# Patient Record
Sex: Male | Born: 1990 | Hispanic: Yes | Marital: Single | Smoking: Current some day smoker
Health system: Southern US, Community
[De-identification: ages and names within clinical notes are randomized; demographics above are authoritative.]

## PROBLEM LIST (undated history)

## (undated) DIAGNOSIS — K219 Gastro-esophageal reflux disease without esophagitis: Secondary | ICD-10-CM

## (undated) HISTORY — PX: FINGER SURGERY: SHX640

---
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
## 2010-09-28 ENCOUNTER — Emergency Department: Payer: Self-pay | Admitting: Emergency Medicine

## 2011-03-07 ENCOUNTER — Emergency Department: Payer: Self-pay | Admitting: Emergency Medicine

## 2012-10-24 ENCOUNTER — Emergency Department: Payer: Self-pay | Admitting: Emergency Medicine

## 2014-10-08 ENCOUNTER — Emergency Department: Payer: Self-pay | Admitting: Emergency Medicine

## 2015-09-12 ENCOUNTER — Encounter: Payer: Self-pay | Admitting: Emergency Medicine

## 2015-09-12 ENCOUNTER — Emergency Department
Admission: EM | Admit: 2015-09-12 | Discharge: 2015-09-12 | Disposition: A | Discharge status: Home / Self Care | Payer: Self-pay | Attending: Emergency Medicine | Admitting: Emergency Medicine

## 2015-09-12 DIAGNOSIS — F17200 Nicotine dependence, unspecified, uncomplicated: Secondary | ICD-10-CM | POA: Insufficient documentation

## 2015-09-12 DIAGNOSIS — M7592 Shoulder lesion, unspecified, left shoulder: Secondary | ICD-10-CM | POA: Insufficient documentation

## 2015-09-12 DIAGNOSIS — M7582 Other shoulder lesions, left shoulder: Secondary | ICD-10-CM

## 2015-09-12 MED ORDER — ETODOLAC 500 MG PO TABS: 500.0000 mg | ORAL_TABLET | Freq: Two times a day (BID) | ORAL | Status: DC

## 2015-09-12 NOTE — Discharge Instructions (Signed)

## 2015-09-12 NOTE — ED Notes (Signed)
Pt states has left shoulder pain for over one month. Pt states pain is worse with movement and when lying on left side. Cms intact to left fingers. Pt appears in no acute distress.

## 2015-09-12 NOTE — ED Provider Notes (Signed)
CSN: [id number]     Arrival date & time 09/12/15  2033 History   First MD Initiated Contact with Patient 09/12/15 2056     Chief Complaint  Patient presents with  . Shoulder Pain     (Consider location/radiation/quality/duration/timing/severity/associated sxs/prior Treatment) HPI  24 year old male presents emergent department for evaluation of left shoulder pain. He is left-hand dominant. He has had several months of pain with no trauma or injury. Patient's pain is sharp and located along the lateral deltoid. Pain is increased with overhead lifting pushing and pulling. He denies any neck pain numbness or tingling. He is not taking any medications for pain. He has been given follow-up appointments with orthopedics but, has failed to follow-up.  History reviewed. No pertinent past medical history. History reviewed. No pertinent past surgical history. No family history on file. Social History  Substance Use Topics  . Smoking status: Current Some Day Smoker  . Smokeless tobacco: Never Used  . Alcohol Use: No    Review of Systems  Constitutional: Negative.  Negative for fever, chills, activity change and appetite change.  HENT: Negative for congestion, ear pain, mouth sores, rhinorrhea, sinus pressure, sore throat and trouble swallowing.   Eyes: Negative for photophobia, pain and discharge.  Respiratory: Negative for cough, chest tightness and shortness of breath.   Cardiovascular: Negative for chest pain and leg swelling.  Gastrointestinal: Negative for nausea, vomiting, abdominal pain, diarrhea and abdominal distention.  Genitourinary: Negative for dysuria and difficulty urinating.  Musculoskeletal: Positive for arthralgias. Negative for back pain and gait problem.  Skin: Negative for color change and rash.  Neurological: Negative for dizziness and headaches.  Hematological: Negative for adenopathy.  Psychiatric/Behavioral: Negative for behavioral problems and agitation.       Allergies  Review of patient's allergies indicates no known allergies.  Home Medications   Prior to Admission medications   Medication Sig Start Date End Date Taking? Authorizing Provider  etodolac (LODINE) 500 MG tablet Take 1 tablet (500 mg total) by mouth 2 (two) times daily. 09/12/15   Evon Slack, PA-C   There were no vitals taken for this visit. Physical Exam  Constitutional: He is oriented to person, place, and time. He appears well-developed and well-nourished.  HENT:  Head: Normocephalic and atraumatic.  Eyes: Conjunctivae and EOM are normal. Pupils are equal, round, and reactive to light.  Neck: Normal range of motion. Neck supple.  Cardiovascular: Normal rate, regular rhythm and intact distal pulses.   Pulmonary/Chest: Effort normal. No respiratory distress. He exhibits no tenderness.  Musculoskeletal: Normal range of motion. He exhibits no edema or tenderness.  Left Upper Extremity: Examination of the left shoulder and arm showed no bony abnormality or edema.  Patient has full range of motion of the left shoulder with abduction, flexion, internal, external rotation..  The patient has mild pain with shoulder motion, mostly abduction and flexion.  The patient has a positive Hawkins and positive impingement test.  The patient has a negative drop arm test. The patient has a negative yergasons and speeds test.  The patient is non-tender along the deltoid muscle.  There is no subacromial space tenderness with no AC joint tenderness.  The patient has no instability of the shoulder with anterior-posterior motion.  There is a negative sulcus sign.  The rotator cuff muscle strength is 5/5 with supraspinatus, 5/5 with internal rotation, and 5/5 with external rotation.  There is no crepitus with range of motion activities.    Neurological: The patient has sensation that  is intact to light touch and pinprick bilaterally.  The patient has normal grip strength.  The patient has full  biceps, wrist extension, grip, and interosseous strength.  The patient has 2 + DTRs bilaterally.  Vascular: The patient has less than 2 second capillary refill.  The patient has normal ulnar and radial pulses.  The patient has normal warmth to touch.    Neurological: He is alert and oriented to person, place, and time.  Skin: Skin is warm and dry.  Psychiatric: He has a normal mood and affect. His behavior is normal. Judgment and thought content normal.    ED Course  Procedures (including critical care time) Labs Review Labs Reviewed - No data to display  Imaging Review No results found. I have personally reviewed and evaluated these images and lab results as part of my medical decision-making.   EKG Interpretation None      MDM   Final diagnoses:  Rotator cuff tendinitis, left    24 year old male with off and on low shoulder pain. No trauma or injury. No limitations in range of motion. He is not taking any medications. Will start etodolac 500 mg 1 tab by mouth twice a day with meals. Follow-up with orthopedics in 5-7 days if no improvement.    Evon Slackhomas C Yamin Swingler, PA-C 09/12/15 2133  Phineas SemenGraydon Goodman, MD 09/13/15 [phone]

## 2016-01-27 ENCOUNTER — Emergency Department
Admission: EM | Admit: 2016-01-27 | Discharge: 2016-01-27 | Disposition: A | Discharge status: Home / Self Care | Payer: Self-pay | Attending: Student | Admitting: Student

## 2016-01-27 ENCOUNTER — Encounter: Payer: Self-pay | Admitting: Emergency Medicine

## 2016-01-27 DIAGNOSIS — F17200 Nicotine dependence, unspecified, uncomplicated: Secondary | ICD-10-CM | POA: Insufficient documentation

## 2016-01-27 DIAGNOSIS — Z791 Long term (current) use of non-steroidal anti-inflammatories (NSAID): Secondary | ICD-10-CM | POA: Insufficient documentation

## 2016-01-27 DIAGNOSIS — H9201 Otalgia, right ear: Secondary | ICD-10-CM

## 2016-01-27 DIAGNOSIS — H6011 Cellulitis of right external ear: Secondary | ICD-10-CM | POA: Insufficient documentation

## 2016-01-27 MED ORDER — ACETAMINOPHEN-CODEINE #3 300-30 MG PO TABS: 1.0000 | ORAL_TABLET | Freq: Four times a day (QID) | ORAL | Status: DC | PRN

## 2016-01-27 MED ORDER — CIPROFLOXACIN-HYDROCORTISONE 0.2-1 % OT SUSP: 3.0000 [drp] | Freq: Two times a day (BID) | OTIC | Status: DC

## 2016-01-27 NOTE — ED Notes (Signed)
Pt discharged home after verbalizing understanding of discharge instructions; nad noted. 

## 2016-01-27 NOTE — ED Provider Notes (Signed)
[facility] Emergency Department Provider Note ____________________________________________  Time seen: 1700  I have reviewed the triage vital signs and the nursing notes.  HISTORY  Chief Complaint  Otalgia  HPI Willie Delacruz is a 25 y.o. male to the ED for evaluation of a 4 day complaint of right ear pain. Some decreased hearing in the right side and pain to the posterior part of the ear, the lower part of the jaw and the right neck. He denies any fevers, chills, or sweats. He has denies any vertigo, or dizziness. He has not had any drainage from the ear and denies any recent trauma. He has been using Tylenol, thorax, and ear drops without significant relief to his symptoms.He reports his discomfort at 7/10 in triage.   History reviewed. No pertinent past medical history.  There are no active problems to display for this patient.  History reviewed. No pertinent past surgical history.  Current Outpatient Rx  Name  Route  Sig  Dispense  Refill  . acetaminophen-codeine (TYLENOL #3) 300-30 MG tablet   Oral   Take 1 tablet by mouth every 6 (six) hours as needed for moderate pain.   10 tablet   0   . ciprofloxacin-hydrocortisone (CIPRO HC) otic suspension   Right Ear   Place 3 drops into the right ear 2 (two) times daily. Instill in affected ear as directed   10 mL   0   . etodolac (LODINE) 500 MG tablet   Oral   Take 1 tablet (500 mg total) by mouth 2 (two) times daily.   30 tablet   0    Allergies Review of patient's allergies indicates no known allergies.  History reviewed. No pertinent family history.  Social History Social History  Substance Use Topics  . Smoking status: Current Some Day Smoker  . Smokeless tobacco: Never Used  . Alcohol Use: No    Review of Systems  Constitutional: Negative for fever. Eyes: Negative for visual changes. ENT: Negative for sore throat. Right ear pain as above Skin: Negative for rash. Neurological:  Negative for headaches, focal weakness or numbness. ____________________________________________  PHYSICAL EXAM:  VITAL SIGNS: ED Triage Vitals  Enc Vitals Group     BP 01/27/16 1557 119/71 mmHg     Pulse Rate 01/27/16 1557 61     Resp 01/27/16 1557 18     Temp 01/27/16 1557 98.3 F (36.8 C)     Temp Source 01/27/16 1557 Oral     SpO2 01/27/16 1557 98 %     Weight 01/27/16 1557 232 lb (105.235 kg)     Height 01/27/16 1557 5\' 6"  (1.676 m)     Head Cir --      Peak Flow --      Pain Score 01/27/16 1627 7     Pain Loc --      Pain Edu? --      Excl. in GC? --    Constitutional: Alert and oriented. Well appearing and in no distress. Head: Normocephalic and atraumatic.      Eyes: Conjunctivae are normal. PERRL. Normal extraocular movements      Ears: Canals clear. TMs intact bilaterally. Right ear canal with a small, erupted pustule in the proximal portion. Local erythema noted. Serous drainage noted.    Nose: No congestion/rhinorrhea.   Mouth/Throat: Mucous membranes are moist.   Neck: Supple. No thyromegaly. Hematological/Lymphatic/Immunological: No cervical or preauricular lymphadenopathy. Respiratory: Normal respiratory effort.  Musculoskeletal: Nontender with normal range of motion  in all extremities.  Neurologic:  Normal gait without ataxia. Normal speech and language. No gross focal neurologic deficits are appreciated. ____________________________________________  INITIAL IMPRESSION / ASSESSMENT AND PLAN / ED COURSE  Patient with acute right ear canal pain secondary to an infected pustule/inclusion cyst. He discharged with a prescription for Cortisporin HC and Tylenol #3 to dose as directed. He will follow-up with ENT if needed. ____________________________________________  FINAL CLINICAL IMPRESSION(S) / ED DIAGNOSES  Final diagnoses:  Otalgia of right ear  Cellulitis of ear canal, right      Lissa Hoard, PA-C 01/27/16 1734  Gayla Doss,  MD 01/27/16 2011

## 2016-01-27 NOTE — ED Notes (Signed)
Reports right ear pain.  States he tried many otc products but not helping.

## 2016-01-27 NOTE — Discharge Instructions (Signed)
Ear Drops, Adult You have been diagnosed with a condition requiring you to put drops of medicine into your outer ear. HOME CARE INSTRUCTIONS   Put drops in the affected ear as instructed. After putting the drops in, you will need to lie down with the affected ear facing up for ten minutes so the drops will remain in the ear canal and run down and fill the canal. Continue using the ear drops for as long as directed by your health care provider.  Prior to getting up, put a cotton ball gently in your ear canal. Leave enough of the cotton ball out so it can be easily removed. Do not attempt to push this down into the canal with a cotton-tipped swab or other instrument.  Do not irrigate or wash out your ears if you have had a perforated eardrum or mastoid surgery, or unless instructed to do so by your health care provider.  Keep appointments with your health care provider as instructed.  Finish all medicine, or use for the length of time prescribed by your health care provider. Continue the drops even if your problem seems to be doing well after a couple days, or continue as instructed. SEEK MEDICAL CARE IF:  You become worse or develop increasing pain.  You notice any unusual drainage from your ear (particularly if the drainage has a bad smell).  You develop hearing difficulties.  You experience a serious form of dizziness in which you feel as if the room is spinning, and you feel nauseated (vertigo).  The outside of your ear becomes red or swollen or both. This may be a sign of an allergic reaction. MAKE SURE YOU:   Understand these instructions.  Will watch your condition.  Will get help right away if you are not doing well or get worse.   This information is not intended to replace advice given to you by your health care provider. Make sure you discuss any questions you have with your health care provider.   Document Released: 09/14/2001 Document Revised: 10/11/2014 Document  Reviewed: 04/17/2013 Elsevier Interactive Patient Education 2016 Elsevier Inc.  Cellulitis Cellulitis is an infection of the skin and the tissue under the skin. The infected area is usually red and tender. This happens most often in the arms and lower legs. HOME CARE   Take your antibiotic medicine as told. Finish the medicine even if you start to feel better.  Keep the infected arm or leg raised (elevated).  Put a warm cloth on the area up to 4 times per day.  Only take medicines as told by your doctor.  Keep all doctor visits as told. GET HELP IF:  You see red streaks on the skin coming from the infected area.  Your red area gets bigger or turns a dark color.  Your bone or joint under the infected area is painful after the skin heals.  Your infection comes back in the same area or different area.  You have a puffy (swollen) bump in the infected area.  You have new symptoms.  You have a fever. GET HELP RIGHT AWAY IF:   You feel very sleepy.  You throw up (vomit) or have watery poop (diarrhea).  You feel sick and have muscle aches and pains.   This information is not intended to replace advice given to you by your health care provider. Make sure you discuss any questions you have with your health care provider.   Document Released: 03/08/2008 Document Revised: 06/11/2015 Document  Reviewed: 12/06/2011 Elsevier Interactive Patient Education [employer] Elsevier [employer]   Your have a small pimple in the right ear canal. Use the drops as directed. Take the Tylenol w/ codeine as needed. See the specialist as needed.

## 2016-01-28 ENCOUNTER — Telehealth: Payer: Self-pay | Admitting: Emergency Medicine

## 2016-01-29 ENCOUNTER — Emergency Department
Admission: EM | Admit: 2016-01-29 | Discharge: 2016-01-29 | Disposition: A | Discharge status: Home / Self Care | Payer: Self-pay | Attending: Emergency Medicine | Admitting: Emergency Medicine

## 2016-01-29 DIAGNOSIS — F17200 Nicotine dependence, unspecified, uncomplicated: Secondary | ICD-10-CM | POA: Insufficient documentation

## 2016-01-29 DIAGNOSIS — H6691 Otitis media, unspecified, right ear: Secondary | ICD-10-CM

## 2016-01-29 DIAGNOSIS — Z791 Long term (current) use of non-steroidal anti-inflammatories (NSAID): Secondary | ICD-10-CM | POA: Insufficient documentation

## 2016-01-29 DIAGNOSIS — H6091 Unspecified otitis externa, right ear: Secondary | ICD-10-CM

## 2016-01-29 MED ORDER — PREDNISONE 20 MG PO TABS: ORAL_TABLET | ORAL | Status: DC

## 2016-01-29 MED ORDER — AMOXICILLIN 875 MG PO TABS: 875.0000 mg | ORAL_TABLET | Freq: Two times a day (BID) | ORAL | Status: DC

## 2016-01-29 NOTE — ED Provider Notes (Signed)
[facility] Emergency Department Provider Note  ____________________________________________  Time seen: Approximately 9:19 PM  I have reviewed the triage vital signs and the nursing notes.   HISTORY  Chief Complaint Otalgia    HPI Willie Delacruz is a 25 y.o. male , NAD, presents to the emergency department with continued right ear pain. States he was seen here Saturday diagnosed with an ear infection. Was given antibiotic drops and pain medications in which he states has not helped. Has not followed up with a primary care provider nor ENT since his visit. States he is here as he "wants medications that will actually work". Has not noted any increase in pain but has not noted any relief. Denies any discharge from the ears. Has not had any nasal congestion, runny nose, cough, chest congestion, sinus pressure, headaches, visual changes, sneezing. No fevers, chills, body aches.   No past medical history on file.  There are no active problems to display for this patient.   No past surgical history on file.  Current Outpatient Rx  Name  Route  Sig  Dispense  Refill  . acetaminophen-codeine (TYLENOL #3) 300-30 MG tablet   Oral   Take 1 tablet by mouth every 6 (six) hours as needed for moderate pain.   10 tablet   0   . amoxicillin (AMOXIL) 875 MG tablet   Oral   Take 1 tablet (875 mg total) by mouth 2 (two) times daily.   20 tablet   0   . ciprofloxacin-hydrocortisone (CIPRO HC) otic suspension   Right Ear   Place 3 drops into the right ear 2 (two) times daily. Instill in affected ear as directed   10 mL   0   . etodolac (LODINE) 500 MG tablet   Oral   Take 1 tablet (500 mg total) by mouth 2 (two) times daily.   30 tablet   0   . predniSONE (DELTASONE) 20 MG tablet      Take 2 tablets by mouth, once daily, for 5 days   10 tablet   0     Allergies Review of patient's allergies indicates no known allergies.  No family history on  file.  Social History Social History  Substance Use Topics  . Smoking status: Current Some Day Smoker  . Smokeless tobacco: Never Used  . Alcohol Use: No     Review of Systems  Constitutional: No fever/chills Eyes: No visual changes. No discharge on the swelling, erythema ENT: Positive right ear pain. No sore throat, nasal congestion, sneezing, runny nose, sinus pressure, ear drainage, tinnitus. Cardiovascular: No chest pain. Respiratory: No cough or chest congestion. No shortness of breath. No wheezing.  Gastrointestinal: No abdominal pain.  No nausea, vomiting.  Musculoskeletal: Negative for neck pain, general myalgias.  Skin: Negative for rash. Neurological: Negative for headaches, focal weakness or numbness. 10-point ROS otherwise negative.  ____________________________________________   PHYSICAL EXAM:  VITAL SIGNS: ED Triage Vitals  Enc Vitals Group     BP 01/29/16 2048 148/88 mmHg     Pulse Rate 01/29/16 2048 77     Resp 01/29/16 2048 18     Temp 01/29/16 2048 98.2 F (36.8 C)     Temp Source 01/29/16 2048 Oral     SpO2 01/29/16 2048 98 %     Weight 01/29/16 2048 225 lb (102.059 kg)     Height 01/29/16 2048  (1.676 m)     Head Cir --  Peak Flow --      Pain Score 01/29/16 2049 8     Pain Loc --      Pain Edu? --      Excl. in GC? --      Constitutional: Alert and oriented. Well appearing and in no acute distress. Eyes: Conjunctivae are normal. Head: Atraumatic. ENT:      Ears: Right external ear canal with mild erythema and trace swelling. Right TM visualized with mild erythema and mild serous effusion but no bulging or operation. Left TM visualized within normal limits. Right TM with decreased light reflex but left TM with normal light reflex.      Nose: No congestion/rhinnorhea.      Mouth/Throat: Mucous membranes are moist. Clear postnasal drip. No pharyngeal erythema, swelling, exudate. Neck: Supple with full range of  motion Hematological/Lymphatic/Immunilogical: No cervical lymphadenopathy. Cardiovascular: Normal rate, regular rhythm. Normal S1 and S2.   Respiratory: Normal respiratory effort without tachypnea or retractions. Lungs CTAB with breath sounds noted in all lung fields. Neurologic:  Normal speech and language. No gross focal neurologic deficits are appreciated.  Skin:  Skin is warm, dry and intact. No rash noted. Psychiatric: Mood and affect are normal. Speech and behavior are normal. Patient exhibits appropriate insight and judgement.   ____________________________________________   LABS  None ____________________________________________  EKG  None ____________________________________________  RADIOLOGY  None ____________________________________________    PROCEDURES  Procedure(s) performed: None    Medications - No data to display   ____________________________________________   INITIAL IMPRESSION / ASSESSMENT AND PLAN / ED COURSE  Patient's diagnosis is consistent with right otitis externa and right otitis media. Patient will be discharged home with prescriptions for amoxicillin and prednisone to take as directed. Patient should continue to use ear drops as previously prescribed. Patient is to follow up with Dr. Andee PolesVaught in ENT if symptoms persist past this treatment course. Patient is given ED precautions to return to the ED for any worsening or new symptoms.      ____________________________________________  FINAL CLINICAL IMPRESSION(S) / ED DIAGNOSES  Final diagnoses:  Otitis externa, right  Acute right otitis media, recurrence not specified, unspecified otitis media type      NEW MEDICATIONS STARTED DURING THIS VISIT:  Discharge Medication List as of 01/29/2016  9:20 PM    START taking these medications   Details  amoxicillin (AMOXIL) 875 MG tablet Take 1 tablet (875 mg total) by mouth 2 (two) times daily., Starting 01/29/2016, Until Discontinued,  Print    predniSONE (DELTASONE) 20 MG tablet Take 2 tablets by mouth, once daily, for 5 days, Print             Hope PigeonJami L Jailen Coward, PA-C 01/29/16 2138  Jeanmarie PlantJames A McShane, MD 01/29/16 [phone]

## 2016-01-29 NOTE — ED Notes (Signed)
Pt was seen here Saturday and dx with ear infection has been taking antibiotic drops without any relief.

## 2016-01-29 NOTE — Discharge Instructions (Signed)
Otitis Externa Otitis externa is a germ infection in the outer ear. The outer ear is the area from the eardrum to the outside of the ear. Otitis externa is sometimes called "swimmer's ear." HOME CARE  Put drops in the ear as told by your doctor.  Only take medicine as told by your doctor.  If you have diabetes, your doctor may give you more directions. Follow your doctor's directions.  Keep all doctor visits as told. To avoid another infection:  Keep your ear dry. Use the corner of a towel to dry your ear after swimming or bathing.  Avoid scratching or putting things inside your ear.  Avoid swimming in lakes, dirty water, or pools that use a chemical called chlorine poorly.  You may use ear drops after swimming. Combine equal amounts of white vinegar and alcohol in a bottle. Put 3 or 4 drops in each ear. GET HELP IF:   You have a fever.  Your ear is still red, puffy (swollen), or painful after 3 days.  You still have yellowish-white fluid (pus) coming from the ear after 3 days.  Your redness, puffiness, or pain gets worse.  You have a really bad headache.  You have redness, puffiness, pain, or tenderness behind your ear. MAKE SURE YOU:   Understand these instructions.  Will watch your condition.  Will get help right away if you are not doing well or get worse.   This information is not intended to replace advice given to you by your health care provider. Make sure you discuss any questions you have with your health care provider.   Document Released: 03/08/2008 Document Revised: 10/11/2014 Document Reviewed: 10/07/2011 Elsevier Interactive Patient Education 2016 [employer].  Otitis Media, Adult Otitis media is redness, soreness, and inflammation of the middle ear. Otitis media may be caused by allergies or, most commonly, by infection. Often it occurs as a complication of the common cold. SIGNS AND SYMPTOMS Symptoms of otitis media may  include:  Earache.  Fever.  Ringing in your ear.  Headache.  Leakage of fluid from the ear. DIAGNOSIS To diagnose otitis media, your health care provider will examine your ear with an otoscope. This is an instrument that allows your health care provider to see into your ear in order to examine your eardrum. Your health care provider also will ask you questions about your symptoms. TREATMENT  Typically, otitis media resolves on its own within 3-5 days. Your health care provider may prescribe medicine to ease your symptoms of pain. If otitis media does not resolve within 5 days or is recurrent, your health care provider may prescribe antibiotic medicines if he or she suspects that a bacterial infection is the cause. HOME CARE INSTRUCTIONS   If you were prescribed an antibiotic medicine, finish it all even if you start to feel better.  Take medicines only as directed by your health care provider.  Keep all follow-up visits as directed by your health care provider. SEEK MEDICAL CARE IF:  You have otitis media only in one ear, or bleeding from your nose, or both.  You notice a lump on your neck.  You are not getting better in 3-5 days.  You feel worse instead of better. SEEK IMMEDIATE MEDICAL CARE IF:   You have pain that is not controlled with medicine.  You have swelling, redness, or pain around your ear or stiffness in your neck.  You notice that part of your face is paralyzed.  You notice that the bone  behind your ear (mastoid) is tender when you touch it. °MAKE SURE YOU:  °· Understand these instructions. °· Will watch your condition. °· Will get help right away if you are not doing well or get worse. °  °This information is not intended to replace advice given to you by your health care provider. Make sure you discuss any questions you have with your health care provider. °  °Document Released: 06/25/2004 Document Revised: 10/11/2014 Document Reviewed: 04/17/2013 °Elsevier  Interactive Patient Education ©2016 Elsevier Inc. ° °

## 2016-08-26 ENCOUNTER — Emergency Department
Admission: EM | Admit: 2016-08-26 | Discharge: 2016-08-26 | Disposition: A | Discharge status: Home / Self Care | Payer: Managed Care, Other (non HMO) | Attending: Emergency Medicine | Admitting: Emergency Medicine

## 2016-08-26 DIAGNOSIS — Z79899 Other long term (current) drug therapy: Secondary | ICD-10-CM | POA: Diagnosis not present

## 2016-08-26 DIAGNOSIS — M542 Cervicalgia: Secondary | ICD-10-CM | POA: Diagnosis not present

## 2016-08-26 DIAGNOSIS — F17200 Nicotine dependence, unspecified, uncomplicated: Secondary | ICD-10-CM | POA: Insufficient documentation

## 2016-08-26 DIAGNOSIS — M62838 Other muscle spasm: Secondary | ICD-10-CM | POA: Insufficient documentation

## 2016-08-26 DIAGNOSIS — M25511 Pain in right shoulder: Secondary | ICD-10-CM | POA: Diagnosis present

## 2016-08-26 MED ORDER — ORPHENADRINE CITRATE 30 MG/ML IJ SOLN
60.0000 mg | Freq: Two times a day (BID) | INTRAMUSCULAR | Status: DC
  Administered 2016-08-26: 60 mg via INTRAMUSCULAR
  Filled 2016-08-26: qty 2

## 2016-08-26 MED ORDER — CYCLOBENZAPRINE HCL 10 MG PO TABS: 10.0000 mg | ORAL_TABLET | Freq: Three times a day (TID) | ORAL | 0 refills | Status: DC | PRN

## 2016-08-26 MED ORDER — KETOROLAC TROMETHAMINE 30 MG/ML IJ SOLN
30.0000 mg | Freq: Once | INTRAMUSCULAR | Status: AC
  Administered 2016-08-26: 30 mg via INTRAMUSCULAR
  Filled 2016-08-26: qty 1

## 2016-08-26 MED ORDER — NAPROXEN 500 MG PO TABS: 500.0000 mg | ORAL_TABLET | Freq: Two times a day (BID) | ORAL | 0 refills | Status: DC

## 2016-08-26 NOTE — ED Notes (Signed)
Pt has right shoulder pain.  No known injury.  Pain for 3 days.  Pt states painful when sleeping on right side.  Pain radiates into neck and right hand.  Pt alert.

## 2016-08-26 NOTE — ED Triage Notes (Signed)
Reports right shoulder pain for 2 days that has been steadily increasing.

## 2016-08-26 NOTE — ED Provider Notes (Signed)
[facility] Emergency Department Provider Note  ____________________________________________  Time seen: Approximately 4:57 PM  I have reviewed the triage vital signs and the nursing notes.   HISTORY  Chief Complaint Shoulder Pain    HPI Willie Delacruz is a 25 y.o. male , NAD, presents to the emergency room with 3 day history of right shoulder pain. Patient states he woke with pain about his right neck and shoulder approximately 3 days ago. States the pain has worsened and is now extending into his upper arm and right ear. Has not taken anything over-the-counter for the pain. Denies any injuries, traumas or falls. Has not noted any rashes, redness or swelling. No skin sores. Denies fevers, chills nor any drainage from the ears. Denies any chest pain, shortness breath, wheezing, abdominal pain, nausea or vomiting. No numbness, weakness, tingling.   No past medical history on file.  There are no active problems to display for this patient.   No past surgical history on file.  Prior to Admission medications   Medication Sig Start Date End Date Taking? Authorizing Provider  acetaminophen-codeine (TYLENOL #3) 300-30 MG tablet Take 1 tablet by mouth every 6 (six) hours as needed for moderate pain. 01/27/16   Jenise V Bacon Menshew, PA-C  amoxicillin (AMOXIL) 875 MG tablet Take 1 tablet (875 mg total) by mouth 2 (two) times daily. 01/29/16   Jami L Hagler, PA-C  ciprofloxacin-hydrocortisone (CIPRO HC) otic suspension Place 3 drops into the right ear 2 (two) times daily. Instill in affected ear as directed 01/27/16   Charlesetta IvoryJenise V Bacon Menshew, PA-C  cyclobenzaprine (FLEXERIL) 10 MG tablet Take 1 tablet (10 mg total) by mouth 3 (three) times daily as needed for muscle spasms. 08/26/16   Jami L Hagler, PA-C  etodolac (LODINE) 500 MG tablet Take 1 tablet (500 mg total) by mouth 2 (two) times daily. 09/12/15   Evon Slackhomas C Gaines, PA-C  naproxen (NAPROSYN) 500 MG tablet Take 1  tablet (500 mg total) by mouth 2 (two) times daily with a meal. 08/26/16   Jami L Hagler, PA-C  predniSONE (DELTASONE) 20 MG tablet Take 2 tablets by mouth, once daily, for 5 days 01/29/16   Jami L Hagler, PA-C    Allergies Patient has no known allergies.  No family history on file.  Social History Social History  Substance Use Topics  . Smoking status: Current Some Day Smoker  . Smokeless tobacco: Never Used  . Alcohol use No     Review of Systems  Constitutional: No fever/chills ENT: Positive right ear pain. No ear drainage. Cardiovascular: No chest pain. Respiratory:  No shortness of breath. No wheezing.  Gastrointestinal: No abdominal pain.  No nausea, vomiting.   Musculoskeletal: Positive right neck, shoulder pain. No back pain or other extremity pain. Skin: Negative for rash, redness, swelling, skin sores. Neurological: Negative for this, weakness, tingling. 10-point ROS otherwise negative.  ____________________________________________   PHYSICAL EXAM:  VITAL SIGNS: ED Triage Vitals  Enc Vitals Group     BP 08/26/16 1645 (!) 140/51     Pulse Rate 08/26/16 1645 68     Resp 08/26/16 1645 16     Temp 08/26/16 1645 98 F (36.7 C)     Temp Source 08/26/16 1645 Oral     SpO2 08/26/16 1645 98 %     Weight 08/26/16 1644 220 lb (99.8 kg)     Height 08/26/16 1644 5\' 6"  (1.676 m)     Head Circumference --  Peak Flow --      Pain Score 08/26/16 1646 8     Pain Loc --      Pain Edu? --      Excl. in GC? --      Constitutional: Alert and oriented. Well appearing and in no acute distress. Eyes: Conjunctivae are normal.  Head: Atraumatic. ENT:      Ears: Right TM visualized with out effusion, bulging, erythema or perforation      Nose: No congestion/rhinnorhea.      Mouth/Throat: Mucous membranes are moist.  Neck: No cervical spine tenderness to palpation. Significant tenderness to palpation about the right trapezial muscle with moderate muscle spasm  appreciated. Supple with full range of motion. No meningismus. Hematological/Lymphatic/Immunilogical: No cervical lymphadenopathy. Cardiovascular: Good peripheral circulation. Respiratory: Normal respiratory effort without tachypnea or retractions.  Musculoskeletal: No lower extremity tenderness nor edema.  No joint effusions. Neurologic:  Normal speech and language. No gross focal neurologic deficits are appreciated.  Skin:  Skin is warm, dry and intact. No rash, redness, swelling, skin sores noted. Psychiatric: Mood and affect are normal. Speech and behavior are normal. Patient exhibits appropriate insight and judgement.   ____________________________________________   LABS  None ____________________________________________  EKG  None ____________________________________________  RADIOLOGY  None ____________________________________________    PROCEDURES  Procedure(s) performed: None   Procedures   Medications  orphenadrine (NORFLEX) injection 60 mg (60 mg Intramuscular Given 08/26/16 1708)  ketorolac (TORADOL) 30 MG/ML injection 30 mg (30 mg Intramuscular Given 08/26/16 1708)     ____________________________________________   INITIAL IMPRESSION / ASSESSMENT AND PLAN / ED COURSE  Pertinent labs & imaging results that were available during my care of the patient were reviewed by me and considered in my medical decision making (see chart for details).  Clinical Course     Patient's diagnosis is consistent with Trapezial muscle spasm. Patient was given IM Toradol and Norflex while in the emergency department and tolerated well. Patient will be discharged home with prescriptions for naproxen and Flexeril to take as directed. [facility] if symptoms persist past this treatment course. Patient is given ED precautions to return to the ED for any worsening or new symptoms.    ____________________________________________  FINAL  CLINICAL IMPRESSION(S) / ED DIAGNOSES  Final diagnoses:  Trapezius muscle spasm      NEW MEDICATIONS STARTED DURING THIS VISIT:  New Prescriptions   CYCLOBENZAPRINE (FLEXERIL) 10 MG TABLET    Take 1 tablet (10 mg total) by mouth 3 (three) times daily as needed for muscle spasms.   NAPROXEN (NAPROSYN) 500 MG TABLET    Take 1 tablet (500 mg total) by mouth 2 (two) times daily with a meal.         Hope PigeonJami L Hagler, PA-C 08/26/16 1725    Jeanmarie PlantJames A McShane, MD 08/26/16 [phone]

## 2016-10-23 ENCOUNTER — Encounter: Payer: Self-pay | Admitting: Emergency Medicine

## 2016-10-23 ENCOUNTER — Emergency Department
Admission: EM | Admit: 2016-10-23 | Discharge: 2016-10-23 | Disposition: A | Discharge status: Home / Self Care | Payer: Managed Care, Other (non HMO) | Attending: Emergency Medicine | Admitting: Emergency Medicine

## 2016-10-23 DIAGNOSIS — F17200 Nicotine dependence, unspecified, uncomplicated: Secondary | ICD-10-CM | POA: Insufficient documentation

## 2016-10-23 DIAGNOSIS — J0140 Acute pansinusitis, unspecified: Secondary | ICD-10-CM | POA: Insufficient documentation

## 2016-10-23 DIAGNOSIS — R05 Cough: Secondary | ICD-10-CM | POA: Diagnosis present

## 2016-10-23 LAB — POCT RAPID STREP A: Streptococcus, Group A Screen (Direct): NEGATIVE

## 2016-10-23 MED ORDER — AMOXICILLIN-POT CLAVULANATE 875-125 MG PO TABS: 1.0000 | ORAL_TABLET | Freq: Two times a day (BID) | ORAL | 0 refills | Status: AC

## 2016-10-23 MED ORDER — PREDNISONE 10 MG PO TABS: ORAL_TABLET | ORAL | 0 refills | Status: DC

## 2016-10-23 NOTE — ED Provider Notes (Signed)
[facility] Emergency Department Provider Note  ____________________________________________   First MD Initiated Contact with Patient 10/23/16 [phone]     (approximate)  I have reviewed the triage vital signs and the nursing notes.   HISTORY  Chief Complaint Sore Throat   HPI Willie Delacruz is a 26 y.o. male is here with complaint of sore throat for 3 weeks. Patient states he has had an occasional cough with this. More recently he has experienced facial pain and body aches. He states that when he blows his nose his ear popping, he is also experiencing productive cough at times. Patient has been using over-the-counter medication without any relief of his symptoms. He also has continued using throat lozenges which have not helped with his sore throat. Patient admits to occasionally smoking and at the most smokes one half pack cigarettes per day. He is unaware of any previous asthma, bronchitis, pneumonia. Currently he rates his pain as 7 out of 10.   History reviewed. No pertinent past medical history.  There are no active problems to display for this patient.   History reviewed. No pertinent surgical history.  Prior to Admission medications   Medication Sig Start Date End Date Taking? Authorizing Provider  amoxicillin-clavulanate (AUGMENTIN) 875-125 MG tablet Take 1 tablet by mouth 2 (two) times daily. 10/23/16 10/30/16  Tommi Rumpshonda L Summers, PA-C  predniSONE (DELTASONE) 10 MG tablet Take 6 tablets  today, on day 2 take 5 tablets, day 3 take 4 tablets, day 4 take 3 tablets, day 5 take  2 tablets and 1 tablet the last day 10/23/16   Tommi Rumpshonda L Summers, PA-C    Allergies Patient has no known allergies.  No family history on file.  Social History Social History  Substance Use Topics  . Smoking status: Current Some Day Smoker    Packs/day: 0.50  . Smokeless tobacco: Never Used  . Alcohol use No    Review of Systems Constitutional: Subjective  fever/chills Eyes: No visual changes. ENT: Positive sore throat. Positive facial pressure. Positive ear pressure. Cardiovascular: Denies chest pain. Respiratory: Denies shortness of breath. Positive productive cough. Gastrointestinal:   No nausea, no vomiting.   Musculoskeletal: Negative for back pain. Neurological: Negative for headaches  10-point ROS otherwise negative.  ____________________________________________   PHYSICAL EXAM:  VITAL SIGNS: ED Triage Vitals  Enc Vitals Group     BP 10/23/16 0531 114/78     Pulse Rate 10/23/16 0531 76     Resp --      Temp 10/23/16 0531 98.3 F (36.8 C)     Temp Source 10/23/16 0531 Oral     SpO2 10/23/16 0531 99 %     Weight 10/23/16 0531 220 lb (99.8 kg)     Height 10/23/16 0531 5\' 6"  (1.676 m)     Head Circumference --      Peak Flow --      Pain Score 10/23/16 0540 7     Pain Loc --      Pain Edu? --      Excl. in GC? --     Constitutional: Alert and oriented. Well appearing and in no acute distress. Eyes: Conjunctivae are normal. PERRL. EOMI. Head: Atraumatic. Nose: Mild congestion/no rhinnorhea.  EACs are clear. TMs are dull with poor light reflex. No erythema or injection was seen. There is moderate tenderness to percussion bilateral frontal and maxillary sinuses. Mouth/Throat: Mucous membranes are moist.  Oropharynx non-erythematous. Positive posterior drainage. Neck: No stridor.   Hematological/Lymphatic/Immunilogical: No  cervical lymphadenopathy. Cardiovascular: Normal rate, regular rhythm. Grossly normal heart sounds.  Good peripheral circulation. Respiratory: Normal respiratory effort.  No retractions. Lungs CTAB. Musculoskeletal: Moves upper and lower extremities without any difficulty. Normal gait was noted. Neurologic:  Normal speech and language. No gross focal neurologic deficits are appreciated. No gait instability. Skin:  Skin is warm, dry and intact. No rash noted. Psychiatric: Mood and affect are normal.  Speech and behavior are normal.  ____________________________________________   LABS (all labs ordered are listed, but only abnormal results are displayed)  Labs Reviewed  POCT RAPID STREP A    PROCEDURES  Procedure(s) performed: None  Procedures  Critical Care performed: No  ____________________________________________   INITIAL IMPRESSION / ASSESSMENT AND PLAN / ED COURSE  Pertinent labs & imaging results that were available during my care of the patient were reviewed by me and considered in my medical decision making (see chart for details).  Patient was made aware that his strep test was negative. Patient was placed on Augmentin 875 twice a day for 10 days along with a prednisone 6 day taper. Patient is aware that he can take Tylenol if needed for pain or fever. He is to follow-up with [facility] if any continued problems.    ___________________________________________   FINAL CLINICAL IMPRESSION(S) / ED DIAGNOSES  Final diagnoses:  Acute non-recurrent pansinusitis      NEW MEDICATIONS STARTED DURING THIS VISIT:  Discharge Medication List as of 10/23/2016  7:38 AM    START taking these medications   Details  amoxicillin-clavulanate (AUGMENTIN) 875-125 MG tablet Take 1 tablet by mouth 2 (two) times daily., Starting Sat 10/23/2016, Until Sat 10/30/2016, Print         Note:  This document was prepared using Dragon voice recognition software and may include unintentional dictation errors.    Tommi Rumps, PA-C 10/23/16 1610    Minna Antis, MD 10/23/16 [phone]

## 2016-10-23 NOTE — Discharge Instructions (Signed)
[facility] if any continued problems. Plan taking Augmentin 875 twice a day for 10 days. Prednisone beginning with 6 tablets today and decreasing each day for the next 6 days. You may continue taking Tylenol if needed for headache or fever. Increase fluids.

## 2016-10-23 NOTE — ED Triage Notes (Signed)
Pt states that he has had a sore throat x3 weeks but now he is having generalized body aches. Pt is ambulatory to triage with NAD noted at this time.

## 2016-10-23 NOTE — ED Notes (Signed)
Pt reports sore throat x 3 weeks, progressively worse. Denies any sick contact. Subjective fevers reported. Pt states every time he coughs his whole face is sore.  Productive cough at times.

## 2018-01-28 ENCOUNTER — Emergency Department
Admission: EM | Admit: 2018-01-28 | Discharge: 2018-01-28 | Disposition: A | Discharge status: Home / Self Care | Payer: Managed Care, Other (non HMO) | Attending: Emergency Medicine | Admitting: Emergency Medicine

## 2018-01-28 ENCOUNTER — Encounter: Payer: Self-pay | Admitting: Emergency Medicine

## 2018-01-28 ENCOUNTER — Other Ambulatory Visit: Payer: Self-pay

## 2018-01-28 DIAGNOSIS — Y33XXXA Other specified events, undetermined intent, initial encounter: Secondary | ICD-10-CM | POA: Insufficient documentation

## 2018-01-28 DIAGNOSIS — F17200 Nicotine dependence, unspecified, uncomplicated: Secondary | ICD-10-CM | POA: Insufficient documentation

## 2018-01-28 DIAGNOSIS — Y998 Other external cause status: Secondary | ICD-10-CM | POA: Insufficient documentation

## 2018-01-28 DIAGNOSIS — Y929 Unspecified place or not applicable: Secondary | ICD-10-CM | POA: Insufficient documentation

## 2018-01-28 DIAGNOSIS — S29012A Strain of muscle and tendon of back wall of thorax, initial encounter: Secondary | ICD-10-CM | POA: Insufficient documentation

## 2018-01-28 DIAGNOSIS — Y93H2 Activity, gardening and landscaping: Secondary | ICD-10-CM | POA: Insufficient documentation

## 2018-01-28 DIAGNOSIS — T148XXA Other injury of unspecified body region, initial encounter: Secondary | ICD-10-CM

## 2018-01-28 MED ORDER — CYCLOBENZAPRINE HCL 10 MG PO TABS: 10.0000 mg | ORAL_TABLET | Freq: Three times a day (TID) | ORAL | 0 refills | Status: DC | PRN

## 2018-01-28 MED ORDER — IBUPROFEN 600 MG PO TABS: 600.0000 mg | ORAL_TABLET | Freq: Three times a day (TID) | ORAL | 0 refills | Status: DC | PRN

## 2018-01-28 MED ORDER — IBUPROFEN 600 MG PO TABS
600.0000 mg | ORAL_TABLET | Freq: Once | ORAL | Status: AC
  Administered 2018-01-28: 600 mg via ORAL
  Filled 2018-01-28: qty 1

## 2018-01-28 MED ORDER — TRAMADOL HCL 50 MG PO TABS: 50.0000 mg | ORAL_TABLET | Freq: Two times a day (BID) | ORAL | 0 refills | Status: DC | PRN

## 2018-01-28 MED ORDER — CYCLOBENZAPRINE HCL 10 MG PO TABS
10.0000 mg | ORAL_TABLET | Freq: Once | ORAL | Status: AC
  Administered 2018-01-28: 10 mg via ORAL
  Filled 2018-01-28: qty 1

## 2018-01-28 MED ORDER — TRAMADOL HCL 50 MG PO TABS
50.0000 mg | ORAL_TABLET | Freq: Once | ORAL | Status: AC
  Administered 2018-01-28: 50 mg via ORAL
  Filled 2018-01-28: qty 1

## 2018-01-28 NOTE — ED Provider Notes (Signed)
[facility] Emergency Department Provider Note   ____________________________________________   First MD Initiated Contact with Patient 01/28/18 [phone]     (approximate)  I have reviewed the triage vital signs and the nursing notes.   HISTORY  Chief Complaint Back Pain    HPI Willie Delacruz is a 27 y.o. male patient complain left upper back pain for 2 days.  Patient state onset of complaint after pulling a tarp doing yard work.  Patient described the pain is "aching".  Patient rates the pain as 10/10.  No palliative measures for complaint.  History reviewed. No pertinent past medical history.  There are no active problems to display for this patient.   History reviewed. No pertinent surgical history.  Prior to Admission medications   Medication Sig Start Date End Date Taking? Authorizing Provider  cyclobenzaprine (FLEXERIL) 10 MG tablet Take 1 tablet (10 mg total) by mouth 3 (three) times daily as needed. 01/28/18   Joni Reining, PA-C  ibuprofen (ADVIL,MOTRIN) 600 MG tablet Take 1 tablet (600 mg total) by mouth every 8 (eight) hours as needed. 01/28/18   Joni Reining, PA-C  predniSONE (DELTASONE) 10 MG tablet Take 6 tablets  today, on day 2 take 5 tablets, day 3 take 4 tablets, day 4 take 3 tablets, day 5 take  2 tablets and 1 tablet the last day 10/23/16   Tommi Rumps, PA-C  traMADol (ULTRAM) 50 MG tablet Take 1 tablet (50 mg total) by mouth every 12 (twelve) hours as needed. 01/28/18   Joni Reining, PA-C    Allergies Patient has no known allergies.  No family history on file.  Social History Social History   Tobacco Use  . Smoking status: Current Some Day Smoker    Packs/day: 0.00  . Smokeless tobacco: Never Used  Substance Use Topics  . Alcohol use: No  . Drug use: No    Review of Systems  Constitutional: No fever/chills Eyes: No visual changes. ENT: No sore throat. Cardiovascular: Denies chest pain. Respiratory: Denies  shortness of breath. Gastrointestinal: No abdominal pain.  No nausea, no vomiting.  No diarrhea.  No constipation. Genitourinary: Negative for dysuria. Musculoskeletal: Upper back pain. Skin: Negative for rash. Neurological: Negative for headaches, focal weakness or numbness.   ____________________________________________   PHYSICAL EXAM:  VITAL SIGNS: ED Triage Vitals  Enc Vitals Group     BP 01/28/18 0752 119/77     Pulse Rate 01/28/18 0752 (!) 52     Resp 01/28/18 0752 20     Temp 01/28/18 0752 98.2 F (36.8 C)     Temp Source 01/28/18 0752 Oral     SpO2 01/28/18 0752 96 %     Weight 01/28/18 0754 220 lb (99.8 kg)     Height 01/28/18 0754  (1.676 m)     Head Circumference --      Peak Flow --      Pain Score 01/28/18 0754 10     Pain Loc --      Pain Edu? --      Excl. in GC? --     Constitutional: Alert and oriented. Well appearing and in no acute distress. Neck: No stridor.  Hematological/Lymphatic/Immunilogical: No cervical lymphadenopathy. Cardiovascular: Normal rate, regular rhythm. Grossly normal heart sounds.  Good peripheral circulation. Respiratory: Normal respiratory effort.  No retractions. Lungs CTAB. Musculoskeletal: No obvious deformity of the upper back.  Patient has moderate guarding palpation inferior left scapular area.  Patient decreased  range of motion with overhead reaching.. Neurologic:  Normal speech and language. No gross focal neurologic deficits are appreciated. No gait instability. Skin:  Skin is warm, dry and intact. No rash noted. Psychiatric: Mood and affect are normal. Speech and behavior are normal.  ____________________________________________   LABS (all labs ordered are listed, but only abnormal results are displayed)  Labs Reviewed - No data to display ____________________________________________  EKG   ____________________________________________  RADIOLOGY  ED MD interpretation:    Official radiology  report(s): No results found.  ____________________________________________   PROCEDURES  Procedure(s) performed: None  Procedures  Critical Care performed: No  ____________________________________________   INITIAL IMPRESSION / ASSESSMENT AND PLAN / ED COURSE  As part of my medical decision making, I reviewed the following data within the electronic MEDICAL RECORD NUMBER    Upper back pain secondary to muscle strain.  Patient given discharge care instruction advised take medication as directed.  Patient advised [facility] or [facility]CP if no improvement in 3 to 5 days.      ____________________________________________   FINAL CLINICAL IMPRESSION(S) / ED DIAGNOSES  Final diagnoses:  Muscle strain     ED Discharge Orders        Ordered    traMADol (ULTRAM) 50 MG tablet  Every 12 hours PRN     01/28/18 0808    ibuprofen (ADVIL,MOTRIN) 600 MG tablet  Every 8 hours PRN     01/28/18 0808    cyclobenzaprine (FLEXERIL) 10 MG tablet  3 times daily PRN     01/28/18 7846       Note:  This document was prepared using Dragon voice recognition software and may include unintentional dictation errors.    Joni Reining, PA-C 01/28/18 0813    Schaevitz, Myra Rude, MD 01/28/18 [phone]

## 2018-01-28 NOTE — Discharge Instructions (Addendum)
Follow discharge care instruction take medication as directed. °

## 2018-01-28 NOTE — ED Notes (Signed)
See triage note  States he was doing some yard yard about 2 days ago   Developed some mid/upper back pain  States pain is moving into left arm and leg  Ambulates well to treatment room

## 2018-01-28 NOTE — ED Triage Notes (Signed)
Back pain x 2 days. States began when pulling tarp doing yard work 2 days ago.

## 2018-04-18 ENCOUNTER — Emergency Department
Admission: EM | Admit: 2018-04-18 | Discharge: 2018-04-18 | Disposition: A | Discharge status: Home / Self Care | Payer: Self-pay | Attending: Emergency Medicine | Admitting: Emergency Medicine

## 2018-04-18 ENCOUNTER — Emergency Department: Payer: Self-pay

## 2018-04-18 ENCOUNTER — Other Ambulatory Visit: Payer: Self-pay

## 2018-04-18 DIAGNOSIS — Y999 Unspecified external cause status: Secondary | ICD-10-CM | POA: Insufficient documentation

## 2018-04-18 DIAGNOSIS — S62651A Nondisplaced fracture of medial phalanx of left index finger, initial encounter for closed fracture: Secondary | ICD-10-CM

## 2018-04-18 DIAGNOSIS — Y93H2 Activity, gardening and landscaping: Secondary | ICD-10-CM | POA: Insufficient documentation

## 2018-04-18 DIAGNOSIS — S61215A Laceration without foreign body of left ring finger without damage to nail, initial encounter: Secondary | ICD-10-CM

## 2018-04-18 DIAGNOSIS — W278XXA Contact with other nonpowered hand tool, initial encounter: Secondary | ICD-10-CM | POA: Insufficient documentation

## 2018-04-18 DIAGNOSIS — S61012A Laceration without foreign body of left thumb without damage to nail, initial encounter: Secondary | ICD-10-CM

## 2018-04-18 DIAGNOSIS — Y929 Unspecified place or not applicable: Secondary | ICD-10-CM | POA: Insufficient documentation

## 2018-04-18 DIAGNOSIS — S62650B Nondisplaced fracture of medial phalanx of right index finger, initial encounter for open fracture: Secondary | ICD-10-CM | POA: Insufficient documentation

## 2018-04-18 DIAGNOSIS — W172XXA Fall into hole, initial encounter: Secondary | ICD-10-CM | POA: Insufficient documentation

## 2018-04-18 DIAGNOSIS — Z23 Encounter for immunization: Secondary | ICD-10-CM | POA: Insufficient documentation

## 2018-04-18 DIAGNOSIS — S61011A Laceration without foreign body of right thumb without damage to nail, initial encounter: Secondary | ICD-10-CM | POA: Insufficient documentation

## 2018-04-18 DIAGNOSIS — F17210 Nicotine dependence, cigarettes, uncomplicated: Secondary | ICD-10-CM | POA: Insufficient documentation

## 2018-04-18 MED ORDER — LIDOCAINE HCL (PF) 1 % IJ SOLN
15.0000 mL | Freq: Once | INTRAMUSCULAR | Status: AC
  Administered 2018-04-18: 15 mL via INTRADERMAL
  Filled 2018-04-18: qty 15

## 2018-04-18 MED ORDER — OXYCODONE-ACETAMINOPHEN 5-325 MG PO TABS
1.0000 | ORAL_TABLET | Freq: Once | ORAL | Status: AC
  Administered 2018-04-18: 1 via ORAL
  Filled 2018-04-18: qty 1

## 2018-04-18 MED ORDER — TETANUS-DIPHTH-ACELL PERTUSSIS 5-2.5-18.5 LF-MCG/0.5 IM SUSP
0.5000 mL | Freq: Once | INTRAMUSCULAR | Status: AC
  Administered 2018-04-18: 0.5 mL via INTRAMUSCULAR
  Filled 2018-04-18: qty 0.5

## 2018-04-18 MED ORDER — OXYCODONE-ACETAMINOPHEN 5-325 MG PO TABS: 1.0000 | ORAL_TABLET | ORAL | 0 refills | Status: DC | PRN

## 2018-04-18 MED ORDER — LIDOCAINE HCL (PF) 1 % IJ SOLN
INTRAMUSCULAR | Status: AC
  Filled 2018-04-18: qty 10

## 2018-04-18 MED ORDER — CEPHALEXIN 500 MG PO CAPS: 500.0000 mg | ORAL_CAPSULE | Freq: Four times a day (QID) | ORAL | 0 refills | Status: AC

## 2018-04-18 NOTE — ED Triage Notes (Signed)
Pt states he cut his thumb and first finger with a hedge trimmer today - bleeding is controlled at this time - bandage applied by 1st nurse

## 2018-04-18 NOTE — ED Provider Notes (Signed)
[facility] Emergency Department Provider Note  ____________________________________________  Time seen: Approximately 6:09 PM  I have reviewed the triage vital signs and the nursing notes.   HISTORY  Chief Complaint Laceration    HPI Willie Delacruz is a 27 y.o. male that presents emergency department for evaluation of hand laceration after falling into a rabbit hole and cutting his hand on a pruning should shears.   He can bend his fingers normally.  Tetanus shot is out of date.   History reviewed. No pertinent past medical history.  There are no active problems to display for this patient.   History reviewed. No pertinent surgical history.  Prior to Admission medications   Medication Sig Start Date End Date Taking? Authorizing Provider  cephALEXin (KEFLEX) 500 MG capsule Take 1 capsule (500 mg total) by mouth 4 (four) times daily for 10 days. 04/18/18 04/28/18  Enid Derry, PA-C  cyclobenzaprine (FLEXERIL) 10 MG tablet Take 1 tablet (10 mg total) by mouth 3 (three) times daily as needed. 01/28/18   Joni Reining, PA-C  ibuprofen (ADVIL,MOTRIN) 600 MG tablet Take 1 tablet (600 mg total) by mouth every 8 (eight) hours as needed. 01/28/18   Joni Reining, PA-C  oxyCODONE-acetaminophen (PERCOCET) 5-325 MG tablet Take 1 tablet by mouth every 4 (four) hours as needed for severe pain. 04/18/18 04/18/19  Enid Derry, PA-C  predniSONE (DELTASONE) 10 MG tablet Take 6 tablets  today, on day 2 take 5 tablets, day 3 take 4 tablets, day 4 take 3 tablets, day 5 take  2 tablets and 1 tablet the last day 10/23/16   Tommi Rumps, PA-C  traMADol (ULTRAM) 50 MG tablet Take 1 tablet (50 mg total) by mouth every 12 (twelve) hours as needed. 01/28/18   Joni Reining, PA-C    Allergies Patient has no known allergies.  No family history on file.  Social History Social History   Tobacco Use  . Smoking status: Current Some Day Smoker    Packs/day: 0.50    Types:  Cigarettes  . Smokeless tobacco: Never Used  Substance Use Topics  . Alcohol use: No  . Drug use: No     Review of Systems  Gastrointestinal: No nausea, no vomiting.  Musculoskeletal: Positive for hand pain. Skin: Negative for rash, ecchymosis. Positive for laceration. Neurological: Negative for numbness or tingling   ____________________________________________   PHYSICAL EXAM:  VITAL SIGNS: ED Triage Vitals  Enc Vitals Group     BP 04/18/18 1203 (!) 133/94     Pulse Rate 04/18/18 1203 (!) 104     Resp 04/18/18 1203 17     Temp 04/18/18 1203 98.6 F (37 C)     Temp Source 04/18/18 1203 Oral     SpO2 04/18/18 1203 95 %     Weight 04/18/18 1202 210 lb (95.3 kg)     Height 04/18/18 1202 5\' 6"  (1.676 m)     Head Circumference --      Peak Flow --      Pain Score 04/18/18 1202 10     Pain Loc --      Pain Edu? --      Excl. in GC? --      Constitutional: Alert and oriented. Well appearing and in no acute distress. Eyes: Conjunctivae are normal. PERRL. EOMI. Head: Atraumatic. ENT:      Ears:      Nose: No congestion/rhinnorhea.      Mouth/Throat: Mucous membranes are moist.  Neck: No stridor.   Cardiovascular: Normal rate, regular rhythm.  Good peripheral circulation. Respiratory: Normal respiratory effort without tachypnea or retractions. Lungs CTAB. Good air entry to the bases with no decreased or absent breath sounds. Musculoskeletal: Full range of motion to all extremities. No gross deformities appreciated.  Able to perform resisted flexion and extension of index finger and thumb. Neurologic:  Normal speech and language. No gross focal neurologic deficits are appreciated.  Skin:  Skin is warm, dry.  2 cm curved laceration to volar distal left thumb.  5 cm laceration to left index finger wrapping from dorsal to volar side. Psychiatric: Mood and affect are normal. Speech and behavior are normal. Patient exhibits appropriate insight and  judgement.   ____________________________________________   LABS (all labs ordered are listed, but only abnormal results are displayed)  Labs Reviewed - No data to display ____________________________________________  EKG   ____________________________________________  RADIOLOGY Lexine Baton, personally viewed and evaluated these images (plain radiographs) as part of my medical decision making, as well as reviewing the written report by the radiologist.  Dg Hand Complete Right  Result Date: 04/18/2018 CLINICAL DATA:  Laceration to the RIGHT index finger and thumb using a hedge trimmer earlier today. Initial encounter. EXAM: RIGHT HAND - COMPLETE 3+ VIEW COMPARISON:  None. FINDINGS: Nondisplaced vertically oriented fracture involving the mid and distal aspect of the MIDDLE phalanx of the index finger with extension to the articular surface. No other fractures. Soft tissue laceration involving the distal tuft of the index finger covered in bandage material. Well-preserved joint spaces. Well-preserved bone mineral density. IMPRESSION: Nondisplaced vertically oriented intra-articular fracture involving the mid and distal aspect of the MIDDLE phalanx of the index finger. Electronically Signed   By: Hulan Saas M.D.   On: 04/18/2018 13:45    ____________________________________________    PROCEDURES  Procedure(s) performed:    Procedures  LACERATION REPAIR Performed by: Enid Derry  Consent: Verbal consent obtained.  Consent given by: patient  Prepped and Draped in normal sterile fashion  Wound explored: No foreign bodies   Laceration Location: index finger  Laceration Length: 5 cm  Anesthesia: None  Local anesthetic: lidocaine 1% without epinephrine  Anesthetic total: 9 ml  Irrigation method: syringe  Amount of cleaning: normal saline  Skin closure: 4-0 nylon  Number of sutures: 23  Technique: Simple interrupted  Patient tolerance: Patient  tolerated the procedure well with no immediate complications.  LACERATION REPAIR Performed by: Enid Derry  Consent: Verbal consent obtained.  Consent given by: patient  Prepped and Draped in normal sterile fashion  Wound explored: No foreign bodies   Laceration Location: thumb  Laceration Length: 2 cm  Anesthesia: None  Local anesthetic: lidocaine 1% without epinephrine  Anesthetic total: 9 ml  Irrigation method: syringe  Amount of cleaning: normal saline  Skin closure: 4-0 nylon  Number of sutures: 5  Technique: Simple interrupted  Patient tolerance: Patient tolerated the procedure well with no immediate complications.  Medications  lidocaine (PF) (XYLOCAINE) 1 % injection (has no administration in time range)  oxyCODONE-acetaminophen (PERCOCET/ROXICET) 5-325 MG per tablet 1 tablet (1 tablet Oral Given 04/18/18 1324)  lidocaine (PF) (XYLOCAINE) 1 % injection 15 mL (15 mLs Intradermal Given 04/18/18 1324)  Tdap (BOOSTRIX) injection 0.5 mL (0.5 mLs Intramuscular Given 04/18/18 1439)     ____________________________________________   INITIAL IMPRESSION / ASSESSMENT AND PLAN / ED COURSE  Pertinent labs & imaging results that were available during my care of the patient were reviewed  by me and considered in my medical decision making (see chart for details).  Review of the [place] CSRS was performed in accordance of the [facility] prior to dispensing any controlled drugs.    Patient's diagnosis is consistent with finger laceration and non displaced ring phalanx fracture.  Vital signs and exam are reassuring.  X-ray consistent with intra articular fracture involving mid and distal phalanx.  Lacerations were cleaned extensively with 1.5 L normal saline and iodine.   Lacerations were repaired with stitches. PA student started laceration repair and I finished repair.   Finger splint was placed.  Tetanus shot was updated.  Patient will be discharged home with prescriptions  for keflex and a short course of percocet. Patient is to follow up with ortho as directed.  Patient is agreeable to call Ortho tomorrow.  Patient is given ED precautions to return to the ED for any worsening or new symptoms.     ____________________________________________  FINAL CLINICAL IMPRESSION(S) / ED DIAGNOSES  Final diagnoses:  Laceration of left ring finger without foreign body without damage to nail, initial encounter  Laceration of left thumb without foreign body without damage to nail, initial encounter  Closed nondisplaced fracture of middle phalanx of left index finger, initial encounter      NEW MEDICATIONS STARTED DURING THIS VISIT:  ED Discharge Orders        Ordered    oxyCODONE-acetaminophen (PERCOCET) 5-325 MG tablet  Every 4 hours PRN     04/18/18 1817    cephALEXin (KEFLEX) 500 MG capsule  4 times daily     04/18/18 1817          This chart was dictated using voice recognition software/Dragon. Despite best efforts to proofread, errors can occur which can change the meaning. Any change was purely unintentional.    Enid DerryWagner, Cumi Sanagustin, PA-C 04/18/18 1858    Pershing ProudSchaevitz, Myra Rudeavid Matthew, MD 04/18/18 2038

## 2018-04-18 NOTE — Discharge Instructions (Signed)
Please call orthopedics tomorrow for an appointment this week. Keep sutures dry and wear splint until seen by ortho.

## 2018-04-28 ENCOUNTER — Emergency Department
Admission: EM | Admit: 2018-04-28 | Discharge: 2018-04-28 | Disposition: A | Discharge status: Home / Self Care | Payer: Self-pay | Attending: Emergency Medicine | Admitting: Emergency Medicine

## 2018-04-28 ENCOUNTER — Encounter: Payer: Self-pay | Admitting: Emergency Medicine

## 2018-04-28 DIAGNOSIS — S61210D Laceration without foreign body of right index finger without damage to nail, subsequent encounter: Secondary | ICD-10-CM | POA: Insufficient documentation

## 2018-04-28 DIAGNOSIS — F17210 Nicotine dependence, cigarettes, uncomplicated: Secondary | ICD-10-CM | POA: Insufficient documentation

## 2018-04-28 DIAGNOSIS — X58XXXA Exposure to other specified factors, initial encounter: Secondary | ICD-10-CM | POA: Insufficient documentation

## 2018-04-28 DIAGNOSIS — S61011D Laceration without foreign body of right thumb without damage to nail, subsequent encounter: Secondary | ICD-10-CM | POA: Insufficient documentation

## 2018-04-28 DIAGNOSIS — Z4802 Encounter for removal of sutures: Secondary | ICD-10-CM

## 2018-04-28 MED ORDER — BACITRACIN-NEOMYCIN-POLYMYXIN 400-5-5000 EX OINT
TOPICAL_OINTMENT | CUTANEOUS | Status: AC
  Filled 2018-04-28: qty 1

## 2018-04-28 NOTE — ED Notes (Signed)
Patient discharged to home per MD order. Patient in stable condition, and deemed medically cleared by ED provider for discharge. Discharge instructions reviewed with patient/family using "Teach Back"; verbalized understanding of medication education and administration, and information about follow-up care. Denies further concerns. ° °

## 2018-04-28 NOTE — ED Provider Notes (Signed)
[facility] Emergency Department Provider Note  ____________________________________________  Time seen: Approximately 6:42 PM  I have reviewed the triage vital signs and the nursing notes.   HISTORY  Chief Complaint Suture / Staple Removal    HPI Willie Delacruz is a 27 y.o. male resents the emergency department for suture removal.  Patient was seen in this department 10 days ago with lacerations to the first and second digit of the right hand.  Patient denies any complications and is here for suture removal only.  Patient had 20 3 sutures placed to the index finger, 5 to the thumb.  Patient finishes the antibiotics as prescribed.  No other complaints at this time.    History reviewed. No pertinent past medical history.  There are no active problems to display for this patient.   History reviewed. No pertinent surgical history.  Prior to Admission medications   Medication Sig Start Date End Date Taking? Authorizing Provider  cephALEXin (KEFLEX) 500 MG capsule Take 1 capsule (500 mg total) by mouth 4 (four) times daily for 10 days. 04/18/18 04/28/18  Enid DerryWagner, Ashley, PA-C  cyclobenzaprine (FLEXERIL) 10 MG tablet Take 1 tablet (10 mg total) by mouth 3 (three) times daily as needed. 01/28/18   Joni ReiningSmith, Ronald K, PA-C  ibuprofen (ADVIL,MOTRIN) 600 MG tablet Take 1 tablet (600 mg total) by mouth every 8 (eight) hours as needed. 01/28/18   Joni ReiningSmith, Ronald K, PA-C  oxyCODONE-acetaminophen (PERCOCET) 5-325 MG tablet Take 1 tablet by mouth every 4 (four) hours as needed for severe pain. 04/18/18 04/18/19  Enid DerryWagner, Ashley, PA-C  predniSONE (DELTASONE) 10 MG tablet Take 6 tablets  today, on day 2 take 5 tablets, day 3 take 4 tablets, day 4 take 3 tablets, day 5 take  2 tablets and 1 tablet the last day 10/23/16   Tommi RumpsSummers, Rhonda L, PA-C  traMADol (ULTRAM) 50 MG tablet Take 1 tablet (50 mg total) by mouth every 12 (twelve) hours as needed. 01/28/18   Joni ReiningSmith, Ronald K, PA-C     Allergies Patient has no known allergies.  No family history on file.  Social History Social History   Tobacco Use  . Smoking status: Current Some Day Smoker    Packs/day: 0.50    Types: Cigarettes  . Smokeless tobacco: Never Used  Substance Use Topics  . Alcohol use: No  . Drug use: No     Review of Systems  Constitutional: No fever/chills Eyes: No visual changes.  Cardiovascular: no chest pain. Respiratory: no cough. No SOB. Gastrointestinal: No abdominal pain.  No nausea, no vomiting.  Musculoskeletal: Negative for musculoskeletal pain. Skin: 2 sutures lacerations to the thumb, index finger Neurological: Negative for headaches, focal weakness or numbness. 10-point ROS otherwise negative.  ____________________________________________   PHYSICAL EXAM:  VITAL SIGNS: ED Triage Vitals  Enc Vitals Group     BP 04/28/18 1838 134/84     Pulse Rate 04/28/18 1838 64     Resp 04/28/18 1838 18     Temp 04/28/18 1838 98.2 F (36.8 C)     Temp src --      SpO2 04/28/18 1838 97 %     Weight 04/28/18 1827 210 lb (95.3 kg)     Height 04/28/18 1827 5\' 6"  (1.676 m)     Head Circumference --      Peak Flow --      Pain Score 04/28/18 1827 0     Pain Loc --      Pain Edu? --  Excl. in GC? --      Constitutional: Alert and oriented. Well appearing and in no acute distress. Eyes: Conjunctivae are normal. PERRL. EOMI. Head: Atraumatic. Neck: No stridor.    Cardiovascular: Normal rate, regular rhythm. Normal S1 and S2.  Good peripheral circulation. Respiratory: Normal respiratory effort without tachypnea or retractions. Lungs CTAB. Good air entry to the bases with no decreased or absent breath sounds. Musculoskeletal: Full range of motion to all extremities. No gross deformities appreciated. Neurologic:  Normal speech and language. No gross focal neurologic deficits are appreciated.  Skin:  Skin is warm, dry and intact. No rash noted.  2 lacerations are  appreciated, first to the thumb, second to the index finger of the right hand.  All sutures are in place.  20 3 sutures in place to the right second digit, 5 to the right thumb.  Area appears to be healed well with no erythema, edema, dehiscence.  No indication of infection.  Patient has good range of motion to the first and second digit. Psychiatric: Mood and affect are normal. Speech and behavior are normal. Patient exhibits appropriate insight and judgement.   ____________________________________________   LABS (all labs ordered are listed, but only abnormal results are displayed)  Labs Reviewed - No data to display ____________________________________________  EKG   ____________________________________________  RADIOLOGY   No results found.  ____________________________________________    PROCEDURES  Procedure(s) performed:    .Suture Removal Date/Time: 04/28/2018 6:44 PM Performed by: Racheal Patches, PA-C Authorized by: Racheal Patches, PA-C   Consent:    Consent obtained:  Verbal   Consent given by:  Patient   Risks discussed:  Bleeding, pain and wound separation Location:    Location:  Upper extremity   Upper extremity location:  Hand   Hand location:  R index finger Procedure details:    Wound appearance:  No signs of infection and good wound healing   Number of sutures removed:  23 Post-procedure details:    Post-removal:  No dressing applied   Patient tolerance of procedure:  Tolerated well, no immediate complications .Suture Removal Date/Time: 04/28/2018 6:45 PM Performed by: Racheal Patches, PA-C Authorized by: Racheal Patches, PA-C   Consent:    Consent obtained:  Verbal   Consent given by:  Patient   Risks discussed:  Bleeding, pain and wound separation Location:    Location:  Upper extremity   Upper extremity location:  Hand   Hand location:  R thumb Procedure details:    Wound appearance:  No signs of infection and  good wound healing   Number of sutures removed:  5 Post-procedure details:    Post-removal:  No dressing applied   Patient tolerance of procedure:  Tolerated well, no immediate complications      Medications - No data to display   ____________________________________________   INITIAL IMPRESSION / ASSESSMENT AND PLAN / ED COURSE  Pertinent labs & imaging results that were available during my care of the patient were reviewed by me and considered in my medical decision making (see chart for details).  Review of the [place] CSRS was performed in accordance of the [facility] prior to dispensing any controlled drugs.      Patient's diagnosis is consistent with suture removal to the thumb and second digit of the right hand.  Patient tolerated well with no complications.  Area was healing appropriately.  No new prescriptions.  Patient will follow primary care as needed..  Patient is given ED precautions to  return to the ED for any worsening or new symptoms.     ____________________________________________  FINAL CLINICAL IMPRESSION(S) / ED DIAGNOSES  Final diagnoses:  Visit for suture removal      NEW MEDICATIONS STARTED DURING THIS VISIT:  ED Discharge Orders    None          This chart was dictated using voice recognition software/Dragon. Despite best efforts to proofread, errors can occur which can change the meaning. Any change was purely unintentional.    Racheal Patches, PA-C 04/28/18 1846    Jeanmarie Plant, MD 04/29/18 [phone]

## 2018-04-28 NOTE — ED Triage Notes (Signed)
Patient presents to the ED for suture removal.  Patient is in no obvious distress at this time.

## 2018-11-06 ENCOUNTER — Other Ambulatory Visit: Payer: Self-pay

## 2018-11-06 ENCOUNTER — Emergency Department
Admission: EM | Admit: 2018-11-06 | Discharge: 2018-11-07 | Disposition: A | Discharge status: Home / Self Care | Payer: Self-pay | Attending: Emergency Medicine | Admitting: Emergency Medicine

## 2018-11-06 DIAGNOSIS — Y908 Blood alcohol level of 240 mg/100 ml or more: Secondary | ICD-10-CM | POA: Insufficient documentation

## 2018-11-06 DIAGNOSIS — F10920 Alcohol use, unspecified with intoxication, uncomplicated: Secondary | ICD-10-CM | POA: Insufficient documentation

## 2018-11-06 DIAGNOSIS — F17210 Nicotine dependence, cigarettes, uncomplicated: Secondary | ICD-10-CM | POA: Insufficient documentation

## 2018-11-06 LAB — CBC
HCT: 46.4 % (ref 39.0–52.0)
Hemoglobin: 15.9 g/dL (ref 13.0–17.0)
MCH: 29.6 pg (ref 26.0–34.0)
MCHC: 34.3 g/dL (ref 30.0–36.0)
MCV: 86.4 fL (ref 80.0–100.0)
NRBC: 0 % (ref 0.0–0.2)
PLATELETS: 271 10*3/uL (ref 150–400)
RBC: 5.37 MIL/uL (ref 4.22–5.81)
RDW: 12.8 % (ref 11.5–15.5)
WBC: 8.1 10*3/uL (ref 4.0–10.5)

## 2018-11-06 LAB — BASIC METABOLIC PANEL
ANION GAP: 8 (ref 5–15)
BUN: 6 mg/dL (ref 6–20)
CALCIUM: 8.7 mg/dL — AB (ref 8.9–10.3)
CO2: 26 mmol/L (ref 22–32)
CREATININE: 0.77 mg/dL (ref 0.61–1.24)
Chloride: 107 mmol/L (ref 98–111)
Glucose, Bld: 106 mg/dL — ABNORMAL HIGH (ref 70–99)
Potassium: 4.1 mmol/L (ref 3.5–5.1)
SODIUM: 141 mmol/L (ref 135–145)

## 2018-11-06 LAB — ETHANOL: ALCOHOL ETHYL (B): 348 mg/dL — AB (ref <10)

## 2018-11-06 NOTE — ED Notes (Addendum)
Pt keeps making statements that "they" are going to harm him. That he needs to "write a will." He then makes a statement about "all cartels die alone."

## 2018-11-06 NOTE — ED Notes (Signed)
Pt was to provide urine sample but purposefully urinated into the toilet and did not provide the sample. Pt remains cooperative but hesitant and resistive. Officer at bedside.

## 2018-11-06 NOTE — ED Provider Notes (Signed)
[facility]  [facility] Emergency Department Provider Note  ____________________________________________   I have reviewed the triage vital signs and the nursing notes. Where available I have reviewed prior notes and, if possible and indicated, outside hospital notes.    HISTORY  Chief Complaint Altered Mental Status    HPI Willie Delacruz is a 28 y.o. male who was brought in because he was intoxicated.  He denies any head injury, he remembers the events of the evening.  He states he has another party to go to he admits to drinking alcohol.  He has no headache.  He has a slight abrasion to the left elbow.  He has no other trauma he states his tetanus is up-to-date.  He was taken under involuntary commitment by the police   History reviewed. No pertinent past medical history.  There are no active problems to display for this patient.   Past Surgical History:  Procedure Laterality Date  . FINGER SURGERY     per pt    Prior to Admission medications   Medication Sig Start Date End Date Taking? Authorizing Provider  cyclobenzaprine (FLEXERIL) 10 MG tablet Take 1 tablet (10 mg total) by mouth 3 (three) times daily as needed. 01/28/18   Joni Reining, PA-C  ibuprofen (ADVIL,MOTRIN) 600 MG tablet Take 1 tablet (600 mg total) by mouth every 8 (eight) hours as needed. 01/28/18   Joni Reining, PA-C  oxyCODONE-acetaminophen (PERCOCET) 5-325 MG tablet Take 1 tablet by mouth every 4 (four) hours as needed for severe pain. 04/18/18 04/18/19  Enid Derry, PA-C  predniSONE (DELTASONE) 10 MG tablet Take 6 tablets  today, on day 2 take 5 tablets, day 3 take 4 tablets, day 4 take 3 tablets, day 5 take  2 tablets and 1 tablet the last day 10/23/16   Tommi Rumps, PA-C  traMADol (ULTRAM) 50 MG tablet Take 1 tablet (50 mg total) by mouth every 12 (twelve) hours as needed. 01/28/18   Joni Reining, PA-C    Allergies Patient has no known allergies.  History reviewed. No  pertinent family history.  Social History Social History   Tobacco Use  . Smoking status: Current Some Day Smoker    Packs/day: 0.50    Types: Cigarettes  . Smokeless tobacco: Never Used  Substance Use Topics  . Alcohol use: Yes  . Drug use: No    Review of Systems Constitutional: No fever/chills Eyes: No visual changes. ENT: No sore throat. No stiff neck no neck pain Cardiovascular: Denies chest pain. Respiratory: Denies shortness of breath. Gastrointestinal:   no vomiting.  No diarrhea.  No constipation. Genitourinary: Negative for dysuria. Musculoskeletal: Negative lower extremity swelling Skin: Negative for rash. Neurological: Negative for severe headaches, focal weakness or numbness.   ____________________________________________   PHYSICAL EXAM:  VITAL SIGNS: ED Triage Vitals  Enc Vitals Group     BP 11/06/18 2129 106/81     Pulse Rate 11/06/18 2129 87     Resp --      Temp 11/06/18 2129 98.2 F (36.8 C)     Temp Source 11/06/18 2129 Oral     SpO2 11/06/18 2129 97 %     Weight 11/06/18 2131 240 lb (108.9 kg)     Height 11/06/18 2131 5\' 6"  (1.676 m)     Head Circumference --      Peak Flow --      Pain Score 11/06/18 2131 0     Pain Loc --  Pain Edu? --      Excl. in GC? --     Constitutional: At the edge of the bed laughing and joking, smells of beer very heavily, nonfocal.  No evidence of head trauma Eyes: Conjunctivae are normal Head: Atraumatic HEENT: No congestion/rhinnorhea. Mucous membranes are moist.  Oropharynx non-erythematous Neck:   Nontender with no meningismus, no masses, no stridor Cardiovascular: Normal rate, regular rhythm. Grossly normal heart sounds.  Good peripheral circulation. Respiratory: Normal respiratory effort.  No retractions. Lungs CTAB. Abdominal: Soft and nontender. No distention. No guarding no rebound Back:  There is no focal tenderness or step off.  there is no midline tenderness there are no lesions noted. there  is no CVA tenderness Musculoskeletal: No lower extremity tenderness, no upper extremity tenderness. No joint effusions, no DVT signs strong distal pulses no edema Neurologic:  Normal speech and language does appear to be intoxicated however. No gross focal neurologic deficits are appreciated.  Skin:  Skin is warm, dry and intact.  Abrasion noted to the left elbow Psychiatric: Mood and affect are intoxicated. Speech and behavior are normal.  ____________________________________________   LABS (all labs ordered are listed, but only abnormal results are displayed)  Labs Reviewed  ETHANOL - Abnormal; Notable for the following components:      Result Value   Alcohol, Ethyl (B) 348 (*)    All other components within normal limits  BASIC METABOLIC PANEL - Abnormal; Notable for the following components:   Glucose, Bld 106 (*)    Calcium 8.7 (*)    All other components within normal limits  CBC    Pertinent labs  results that were available during my care of the patient were reviewed by me and considered in my medical decision making (see chart for details). ____________________________________________  EKG  I personally interpreted any EKGs ordered by me or triage  ____________________________________________  RADIOLOGY  Pertinent labs & imaging results that were available during my care of the patient were reviewed by me and considered in my medical decision making (see chart for details). If possible, patient and/or family made aware of any abnormal findings.  No results found. ____________________________________________    PROCEDURES  Procedure(s) performed: None  Procedures  Critical Care performed: None  ____________________________________________   INITIAL IMPRESSION / ASSESSMENT AND PLAN / ED COURSE  Pertinent labs & imaging results that were available during my care of the patient were reviewed by me and considered in my medical decision making (see chart for  details).  Patient here with some evidence of intoxication EtOH is elevated, no evidence of closed head injury, he is nonfocal, I think is behavior certainly attributable to his intoxication.  No evidence of head bleed and I do not think the patient would lie still for a CT scan ice really no one to get him hurt by trying to order one that I do not think is necessary.  Signed out to Dr. York Cerise at the end of my shift we will observe him if he sobers up we will hopefully get him home.    ____________________________________________   FINAL CLINICAL IMPRESSION(S) / ED DIAGNOSES  Final diagnoses:  None      This chart was dictated using voice recognition software.  Despite best efforts to proofread,  errors can occur which can change meaning.      Jeanmarie Plant, MD 11/06/18 [phone]

## 2018-11-06 NOTE — ED Notes (Signed)
Pt's Iv gauze wrapped as he keeps trying to pull it out.

## 2018-11-06 NOTE — ED Triage Notes (Signed)
Pt in via EMS from a ditch for intoxication. A&Ox2 (self and month). States NKA, no med hist, L pointer finger "surgery," states he is only under the influence of alcohol.

## 2018-11-06 NOTE — ED Notes (Signed)
Pt at room doorway with officers/security. Cussing and talking about all sorts of random things.

## 2018-11-07 NOTE — ED Notes (Signed)
pts wife in route to ED to pick up pt.

## 2018-11-07 NOTE — ED Notes (Signed)
Wife at front desk to pick up patient. Pt walked out with staff.

## 2018-11-07 NOTE — ED Notes (Addendum)
Attempted to give pt a hospital phone to make a phone call for a family to pick him up. Pt states "this is your personal phone and you can trace the phone number". Pt noticed to become a little agitated. Reassured pt that the phones are hospital based and we do not use them to track phone numbers. Pt states he rather not use them and prefers to stay here instead. Made pt aware that he was not allowed to leave without a family member being present. Erie NoeVanessa, RN notified.

## 2018-11-07 NOTE — Discharge Instructions (Addendum)
You were seen in the emergency department for alcohol intoxication.  Please seek help from the recommended resources for assistance with your alcohol dependence.  If you have any thoughts of hurting herself or others, please call 911 or return to the emergency department.  Please avoid drug and alcohol use.  Never drive a vehicle or operate machinery while intoxicated.  

## 2018-11-07 NOTE — ED Notes (Signed)
Meal tray and fluids provided.

## 2018-11-07 NOTE — ED Provider Notes (Signed)
-----------------------------------------   1:03 AM on 11/07/2018 -----------------------------------------   Blood pressure 106/81, pulse 100, temperature 98.2 F (36.8 C), temperature source Oral, height 1.676 m (5\' 6" ), weight 108.9 kg, SpO2 98 %.  The patient is calm and cooperative at this time.  He is clinically sober, ambulatory without difficulty, making sense, in no acute distress, and indicates no injuries or pain at this time.  He was able to call his wife who is coming to pick him up.  He does not meet any criteria for involuntary commitment nor for inpatient treatment at this time, so I have rescinded the IVC and will discharge him once his wife is here and can take him home.   Loleta Rose, MD 11/07/18 [phone]

## 2018-11-13 ENCOUNTER — Other Ambulatory Visit: Payer: Self-pay

## 2018-11-13 ENCOUNTER — Encounter: Payer: Self-pay | Admitting: Emergency Medicine

## 2018-11-13 ENCOUNTER — Emergency Department
Admission: EM | Admit: 2018-11-13 | Discharge: 2018-11-13 | Disposition: A | Discharge status: Home / Self Care | Payer: Self-pay | Attending: Emergency Medicine | Admitting: Emergency Medicine

## 2018-11-13 ENCOUNTER — Emergency Department: Payer: Self-pay

## 2018-11-13 DIAGNOSIS — W1830XA Fall on same level, unspecified, initial encounter: Secondary | ICD-10-CM | POA: Insufficient documentation

## 2018-11-13 DIAGNOSIS — Y999 Unspecified external cause status: Secondary | ICD-10-CM | POA: Insufficient documentation

## 2018-11-13 DIAGNOSIS — M25562 Pain in left knee: Secondary | ICD-10-CM | POA: Insufficient documentation

## 2018-11-13 DIAGNOSIS — F17210 Nicotine dependence, cigarettes, uncomplicated: Secondary | ICD-10-CM | POA: Insufficient documentation

## 2018-11-13 DIAGNOSIS — Y929 Unspecified place or not applicable: Secondary | ICD-10-CM | POA: Insufficient documentation

## 2018-11-13 DIAGNOSIS — Y939 Activity, unspecified: Secondary | ICD-10-CM | POA: Insufficient documentation

## 2018-11-13 NOTE — ED Triage Notes (Signed)
Pt here with c/o left knee pain for about a week now, fell on uneven ground and pain ever since, limped to triage NAD.

## 2018-11-13 NOTE — ED Provider Notes (Signed)
[facility] Emergency Department Provider Note  ____________________________________________   First MD Initiated Contact with Patient 11/13/18 1000     (approximate)  I have reviewed the triage vital signs and the nursing notes.   HISTORY  Chief Complaint Knee Pain    HPI Willie Delacruz is a 28 y.o. male presents emergency department stating he fell about a week ago and landed on left knee.  Continues to have pain.  He states the pain is worse with extension.  No numbness or tingling.  No other injury.    History reviewed. No pertinent past medical history.  There are no active problems to display for this patient.   Past Surgical History:  Procedure Laterality Date  . FINGER SURGERY     per pt    Prior to Admission medications   Not on File    Allergies Patient has no known allergies.  No family history on file.  Social History Social History   Tobacco Use  . Smoking status: Current Some Day Smoker    Packs/day: 0.50    Types: Cigarettes  . Smokeless tobacco: Never Used  Substance Use Topics  . Alcohol use: Yes  . Drug use: No    Review of Systems  Constitutional: No fever/chills Eyes: No visual changes. ENT: No sore throat. Respiratory: Denies cough Genitourinary: Negative for dysuria. Musculoskeletal: Negative for back pain.  Positive for left knee pain Skin: Negative for rash.    ____________________________________________   PHYSICAL EXAM:  VITAL SIGNS: ED Triage Vitals  Enc Vitals Group     BP 11/13/18 0926 (!) 126/103     Pulse Rate 11/13/18 0926 (!) 56     Resp 11/13/18 0926 16     Temp 11/13/18 0926 97.9 F (36.6 C)     Temp Source 11/13/18 0926 Oral     SpO2 11/13/18 0926 98 %     Weight 11/13/18 0927 230 lb (104.3 kg)     Height 11/13/18 0927 5\' 6"  (1.676 m)     Head Circumference --      Peak Flow --      Pain Score 11/13/18 0927 6     Pain Loc --      Pain Edu? --      Excl. in GC? --      Constitutional: Alert and oriented. Well appearing and in no acute distress. Eyes: Conjunctivae are normal.  Head: Atraumatic. Nose: No congestion/rhinnorhea. Mouth/Throat: Mucous membranes are moist.   Neck:  supple no lymphadenopathy noted Cardiovascular: Normal rate, regular rhythm. Respiratory: Normal respiratory effort.  No retractions, GU: deferred Musculoskeletal: FROM all extremities, warm and well perfused, left knee is tender at the suprapatellar bursa, joint line is tender, extension reproduces pain.  Neurovascular is intact, ligaments appear to be intact with the patient is guarding Neurologic:  Normal speech and language.  Skin:  Skin is warm, dry and intact. No rash noted. Psychiatric: Mood and affect are normal. Speech and behavior are normal.  ____________________________________________   LABS (all labs ordered are listed, but only abnormal results are displayed)  Labs Reviewed - No data to display ____________________________________________   ____________________________________________  RADIOLOGY  X-ray left knee is negative  ____________________________________________   PROCEDURES  Procedure(s) performed: Knee immobilizer  Procedures    ____________________________________________   INITIAL IMPRESSION / ASSESSMENT AND PLAN / ED COURSE  Pertinent labs & imaging results that were available during my care of the patient were reviewed by me and considered in my  medical decision making (see chart for details).   Patient is 28 year old male presents emergency department complaining of left knee after fall about a week ago.  Physical exam shows the left knee is tender and swollen.  X-ray left knee is negative  Explained the results to the patient.  He is placed in a knee immobilizer.  He is instructed to apply ice.  Is given a work note for today and tomorrow for him to elevate and ice the knee.  Follow-up with orthopedics in 1 week if not  improved.  He states he understands will comply.  He is taking over-the-counter ibuprofen.  Is discharged stable condition.     As part of my medical decision making, I reviewed the following data within the electronic MEDICAL RECORD NUMBER Nursing notes reviewed and incorporated, Old chart reviewed, Radiograph reviewed x-ray left knee is negative, Notes from prior ED visits and [place] Controlled Substance Database  ____________________________________________   FINAL CLINICAL IMPRESSION(S) / ED DIAGNOSES  Final diagnoses:  Acute pain of left knee      NEW MEDICATIONS STARTED DURING THIS VISIT:  Current Discharge Medication List       Note:  This document was prepared using Dragon voice recognition software and may include unintentional dictation errors.    Faythe Ghee, PA-C 11/13/18 1040    Schaevitz, Myra Rude, MD 11/13/18 [phone]

## 2018-11-13 NOTE — Discharge Instructions (Addendum)
Follow-up with Dr. Hyacinth Meeker if you are not better in 1 week.  Please call for an appointment.  Take over-the-counter ibuprofen.  Apply ice to the knee.  Return if worsening.

## 2018-11-13 NOTE — ED Notes (Signed)
See triage note  States he fell about 1 week ago  Landed on left knee  conts to have pain  Ambulates with slight limp d/t pain

## 2019-04-18 ENCOUNTER — Other Ambulatory Visit: Payer: Self-pay

## 2019-04-18 ENCOUNTER — Encounter: Payer: Self-pay | Admitting: Emergency Medicine

## 2019-04-18 DIAGNOSIS — Y939 Activity, unspecified: Secondary | ICD-10-CM | POA: Insufficient documentation

## 2019-04-18 DIAGNOSIS — X58XXXA Exposure to other specified factors, initial encounter: Secondary | ICD-10-CM | POA: Insufficient documentation

## 2019-04-18 DIAGNOSIS — F17210 Nicotine dependence, cigarettes, uncomplicated: Secondary | ICD-10-CM | POA: Insufficient documentation

## 2019-04-18 DIAGNOSIS — Y929 Unspecified place or not applicable: Secondary | ICD-10-CM | POA: Insufficient documentation

## 2019-04-18 DIAGNOSIS — Y999 Unspecified external cause status: Secondary | ICD-10-CM | POA: Insufficient documentation

## 2019-04-18 DIAGNOSIS — S161XXA Strain of muscle, fascia and tendon at neck level, initial encounter: Secondary | ICD-10-CM | POA: Insufficient documentation

## 2019-04-18 NOTE — ED Triage Notes (Signed)
Patient ambulatory to triage with steady gait, without difficulty or distress noted, mask in place; pt reports x wk having rt sided neck/shoulder pain with ROM head; denies any known injury, st "thought I slept on it wrong"; st pain increases with "chewing, talking or swallowing"; pt denies any accomp symptoms

## 2019-04-19 ENCOUNTER — Emergency Department
Admission: EM | Admit: 2019-04-19 | Discharge: 2019-04-19 | Disposition: A | Discharge status: Home / Self Care | Payer: Self-pay | Attending: Emergency Medicine | Admitting: Emergency Medicine

## 2019-04-19 DIAGNOSIS — S161XXA Strain of muscle, fascia and tendon at neck level, initial encounter: Secondary | ICD-10-CM

## 2019-04-19 DIAGNOSIS — M62838 Other muscle spasm: Secondary | ICD-10-CM

## 2019-04-19 MED ORDER — CYCLOBENZAPRINE HCL 10 MG PO TABS
5.0000 mg | ORAL_TABLET | Freq: Once | ORAL | Status: AC
  Administered 2019-04-19: 5 mg via ORAL
  Filled 2019-04-19: qty 1

## 2019-04-19 MED ORDER — CYCLOBENZAPRINE HCL 5 MG PO TABS: 5.0000 mg | ORAL_TABLET | Freq: Three times a day (TID) | ORAL | 0 refills | Status: DC | PRN

## 2019-04-19 NOTE — ED Provider Notes (Signed)
[facility] [place] Emergency Department Provider Note  ____________________________________________   First MD Initiated Contact with Patient 04/19/19 [phone]     (approximate)  I have reviewed the triage vital signs and the nursing notes.   HISTORY  Chief Complaint Neck Pain  Level 5 caveat:  history/ROS limited by acute/critical illness  HPI Willie BiJuan M Befort is a 28 y.o. male with no chronic medical issues who presents for evaluation of about 5days pain in his right side of his neck and upper part of her right shoulder.  He says that he is not working currently because of the coronavirus but he works out and [employer] weights.  He had relatively acute onset of the pain in his right shoulder and right side of the neck that has been persistent for the last 5 days.  He says that it radiates from top of the shoulder and upper side of his neck and now he has a little bit of a headache as well.  He believes he can feel a knot in his shoulder and also on the side of his neck.  He has no trouble swallowing or speaking any denies sore throat.  He denies fever/chills, chest pain or shortness of breath, nausea, vomiting, and abdominal pain.  He had no traumatic injury.  He said that it seemed to start after he "slept funny" on his right arm but it is not gone away.  He has been trying icy hot and only took a dose of ibuprofen last night.          History reviewed. No pertinent past medical history.  There are no active problems to display for this patient.   Past Surgical History:  Procedure Laterality Date  . FINGER SURGERY     per pt    Prior to Admission medications   Medication Sig Start Date End Date Taking? Authorizing Provider  cyclobenzaprine (FLEXERIL) 5 MG tablet Take 1 tablet (5 mg total) by mouth 3 (three) times daily as needed for muscle spasms. 04/19/19   Loleta RoseForbach, Wille Aubuchon, MD    Allergies Patient has no known allergies.  No family history on file.  Social  History Social History   Tobacco Use  . Smoking status: Current Some Day Smoker    Packs/day: 0.50    Types: Cigarettes  . Smokeless tobacco: Never Used  Substance Use Topics  . Alcohol use: Yes  . Drug use: No    Review of Systems Constitutional: No fever/chills Eyes: No visual changes. ENT: No sore throat. Cardiovascular: Denies chest pain. Respiratory: Denies shortness of breath. Gastrointestinal: No abdominal pain.   Musculoskeletal: Pain in the right side of his shoulder and neck as described above. Integumentary: Negative for rash. Neurological: Negative for headaches, focal weakness or numbness.   ____________________________________________   PHYSICAL EXAM:  VITAL SIGNS: ED Triage Vitals  Enc Vitals Group     BP 04/18/19 2343 132/90     Pulse Rate 04/18/19 2343 60     Resp 04/18/19 2343 18     Temp 04/18/19 2343 98.9 F (37.2 C)     Temp Source 04/18/19 2343 Oral     SpO2 04/18/19 2343 98 %     Weight 04/18/19 2335 95.3 kg (210 lb)     Height 04/18/19 2335 1.676 m (5\' 6" )     Head Circumference --      Peak Flow --      Pain Score 04/18/19 2335 8     Pain Loc --  Pain Edu? --      Excl. in GC? --     Constitutional: Alert and oriented. Well appearing and in no acute distress. Eyes: Conjunctivae are normal.  Head: Atraumatic. Nose: No congestion/rhinnorhea. Mouth/Throat: Mucous membranes are moist. Neck: No stridor.  No meningeal signs.  There is no lymphadenopathy, no evidence of cellulitis or abscess, no fluctuance nor induration. Respiratory: Normal respiratory effort.  No retractions.  Musculoskeletal: The patient has tenderness to palpation of the right side of his neck and the muscles of the top of his right shoulder.  There is no gross abnormality.  He has full and normal passive and active range of motion of his right arm and shoulder with no limitation to the range and no acute abnormalities identified.  His muscles particularly in the top  of his shoulder feels very tight. Neurologic:  Normal speech and language. No gross focal neurologic deficits are appreciated.  Skin:  Skin is warm, dry and intact. No rash noted. Psychiatric: Mood and affect are normal. Speech and behavior are normal.  ____________________________________________   LABS (all labs ordered are listed, but only abnormal results are displayed)  Labs Reviewed - No data to display ____________________________________________  EKG  None - EKG not ordered by ED physician ____________________________________________  RADIOLOGY   ED MD interpretation: No indication for imaging  Official radiology report(s): No results found.  ____________________________________________   PROCEDURES   Procedure(s) performed (including Critical Care):  Procedures   ____________________________________________   INITIAL IMPRESSION / MDM / ASSESSMENT AND PLAN / ED COURSE  As part of my medical decision making, I reviewed the following data within the electronic MEDICAL RECORD NUMBER Nursing notes reviewed and incorporated, Notes from prior ED visits and  Controlled Substance Database   Musculoskeletal strain.  No evidence of acute infection, abscess, fracture or dislocation, normal range of motion, no gross deformity.  The patient with weights and I think he either strained something or has some strain as result of sleeping on it wrong.  I encouraged and educated him about using alternating doses of ibuprofen and Tylenol and I am giving him Flexeril 5 mg by mouth as well as a prescription for Flexeril.  I encouraged outpatient follow up, gave usual return precautions.          ____________________________________________  FINAL CLINICAL IMPRESSION(S) / ED DIAGNOSES  Final diagnoses:  Strain of neck muscle, initial encounter  Muscle spasms of neck     MEDICATIONS GIVEN DURING THIS VISIT:  Medications  cyclobenzaprine (FLEXERIL) tablet 5 mg (has no  administration in time range)     ED Discharge Orders         Ordered    cyclobenzaprine (FLEXERIL) 5 MG tablet  3 times daily PRN     04/19/19 0545          *Please note:  Willie Delacruz was evaluated in Emergency Department on 04/19/2019 for the symptoms described in the history of present illness. He was evaluated in the context of the global COVID-19 pandemic, which necessitated consideration that the patient might be at risk for infection with the SARS-CoV-2 virus that causes COVID-19. Institutional protocols and algorithms that pertain to the evaluation of patients at risk for COVID-19 are in a state of rapid change based on information released by regulatory bodies including the CDC and federal and state organizations. These policies and algorithms were followed during the patient's care in the ED.  Some ED evaluations and interventions may be delayed as a result  of limited staffing during the pandemic.*  Note:  This document was prepared using Dragon voice recognition software and may include unintentional dictation errors.   Hinda Kehr, MD 04/19/19 [phone]

## 2019-04-19 NOTE — ED Notes (Signed)
Pt calling out asking when doctor will be in. Explained there are many emergencies and the doctor will be in when he can. Pt appears irritated but understands

## 2019-04-19 NOTE — Discharge Instructions (Signed)
We believe that your symptoms are caused by musculoskeletal strain.  Please read through the included information about additional care such as heating pads, over-the-counter pain medicine.  If you were provided a prescription please use it only as needed and as instructed.  Remember that early mobility and using the affected part of your body is actually better than keeping it immobile.  We recommend you use Tylenol 1000 mg (two extra-strength tablets) every six hours.  You should also take ibuprofen 600 mg three times a day with meals.  Follow-up with the doctor listed as recommended or return to the emergency department with new or worsening symptoms that concern you.

## 2019-06-27 IMAGING — DX DG HAND COMPLETE 3+V*R*
3 series · 3 of 3 positions shown · non-contrast
Comparison: None.

CLINICAL DATA: Laceration to the RIGHT index finger and thumb using
[employer] earlier today. Initial encounter.

EXAM:
RIGHT HAND - COMPLETE 3+ VIEW

[hand ap]
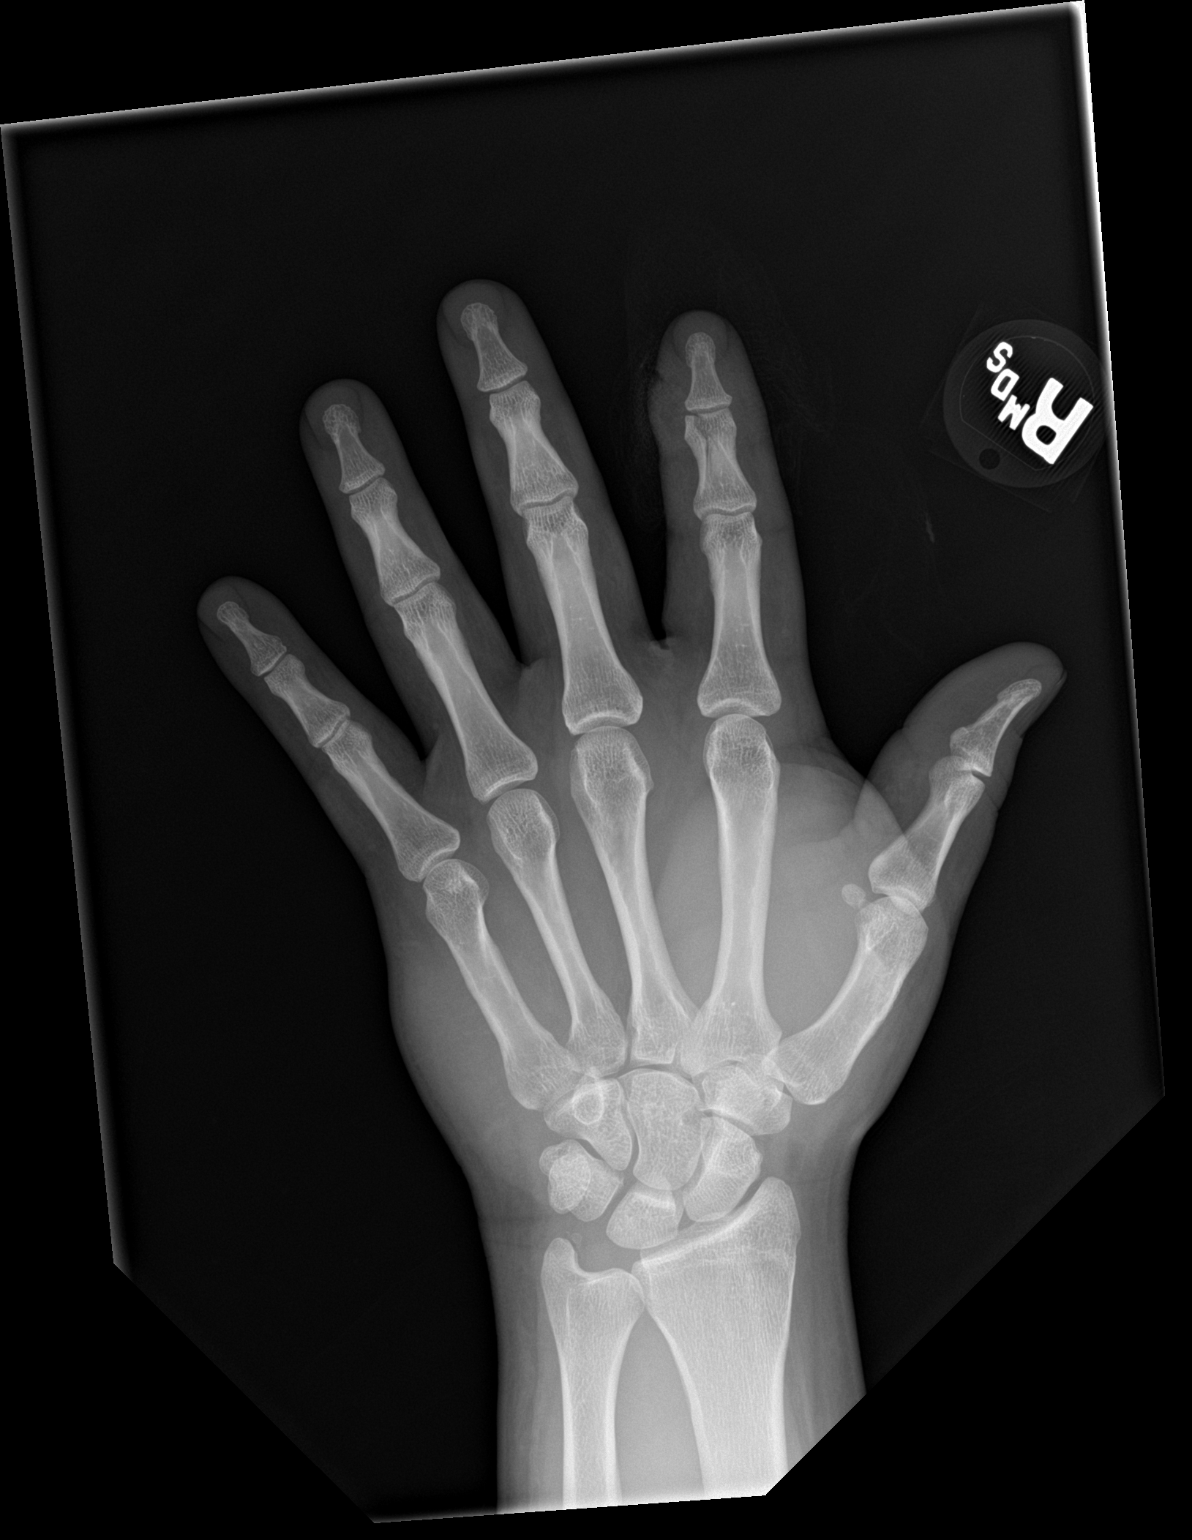

[hand obl]
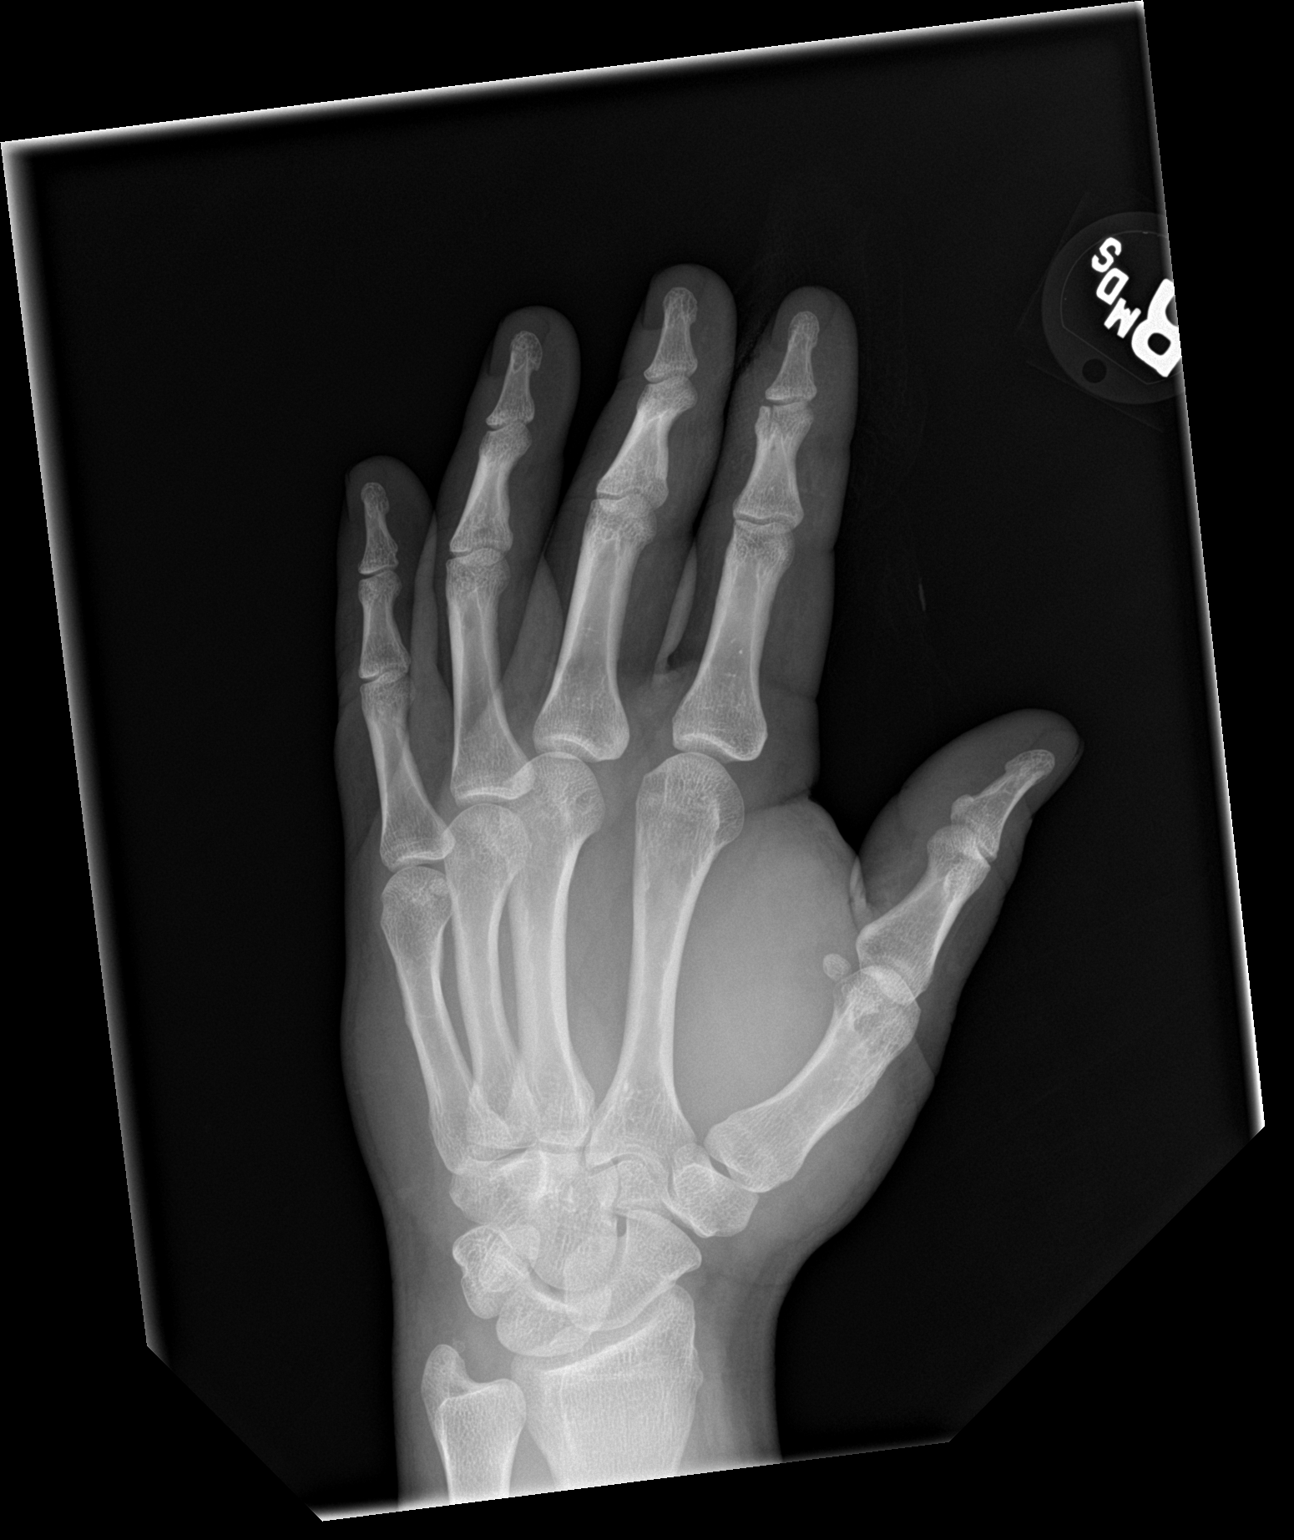

[hand lat]
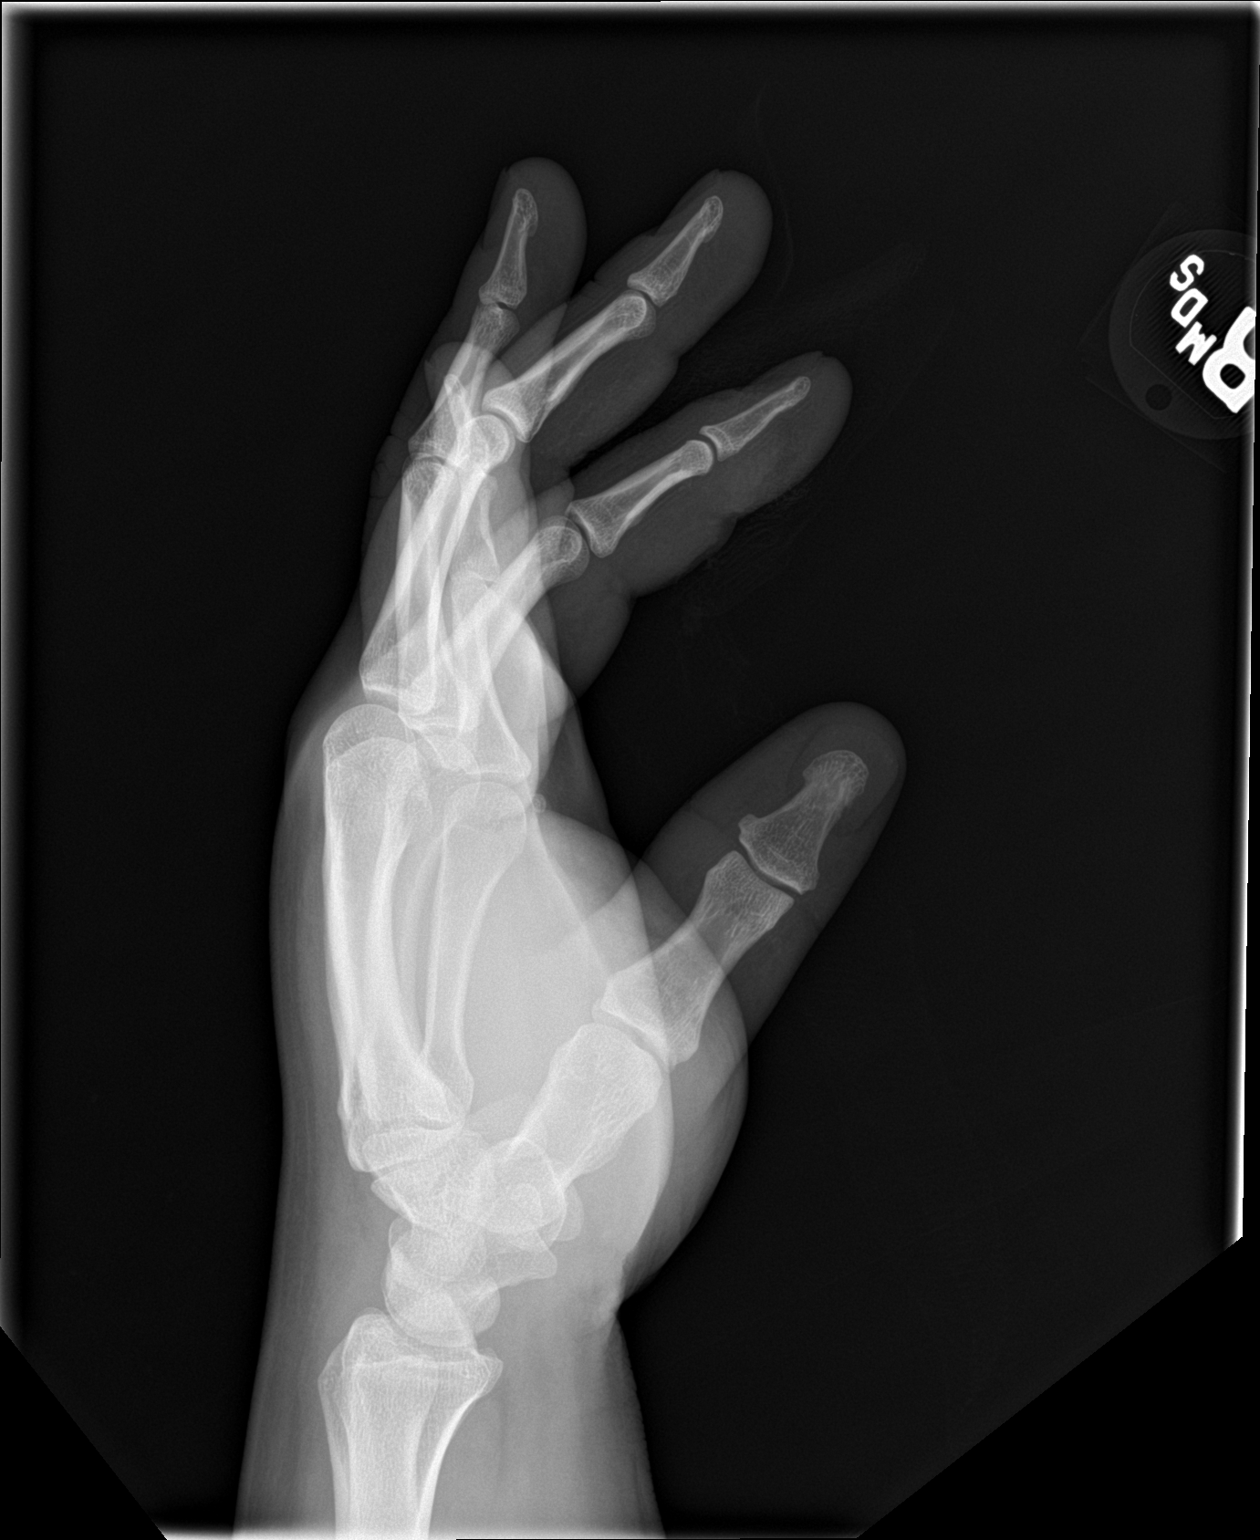

[3 of 3 positions shown; findings below may reference images not displayed]

FINDINGS: Nondisplaced vertically oriented fracture involving the mid and
distal aspect of the MIDDLE phalanx of the index finger with
extension to the articular surface. No other fractures. Soft tissue
laceration involving the distal tuft of the index finger covered in
bandage material. Well-preserved joint spaces. Well-preserved bone
mineral density.
IMPRESSION: Nondisplaced vertically oriented intra-articular fracture involving
the mid and distal aspect of the MIDDLE phalanx of the index finger.

## 2019-08-13 ENCOUNTER — Other Ambulatory Visit: Payer: Self-pay

## 2019-08-13 ENCOUNTER — Encounter: Payer: Self-pay | Admitting: Intensive Care

## 2019-08-13 ENCOUNTER — Inpatient Hospital Stay
Admission: EM | Admit: 2019-08-13 | Discharge: 2019-08-14 | DRG: 639 | Disposition: A | Discharge status: Home / Self Care | Payer: Self-pay | Attending: Internal Medicine | Admitting: Internal Medicine

## 2019-08-13 ENCOUNTER — Emergency Department: Payer: Self-pay

## 2019-08-13 DIAGNOSIS — E1165 Type 2 diabetes mellitus with hyperglycemia: Secondary | ICD-10-CM

## 2019-08-13 DIAGNOSIS — Z833 Family history of diabetes mellitus: Secondary | ICD-10-CM

## 2019-08-13 DIAGNOSIS — Z20828 Contact with and (suspected) exposure to other viral communicable diseases: Secondary | ICD-10-CM | POA: Diagnosis present

## 2019-08-13 DIAGNOSIS — R079 Chest pain, unspecified: Secondary | ICD-10-CM

## 2019-08-13 DIAGNOSIS — K921 Melena: Secondary | ICD-10-CM

## 2019-08-13 DIAGNOSIS — E1110 Type 2 diabetes mellitus with ketoacidosis without coma: Principal | ICD-10-CM

## 2019-08-13 DIAGNOSIS — K219 Gastro-esophageal reflux disease without esophagitis: Secondary | ICD-10-CM | POA: Diagnosis present

## 2019-08-13 DIAGNOSIS — F17210 Nicotine dependence, cigarettes, uncomplicated: Secondary | ICD-10-CM | POA: Diagnosis present

## 2019-08-13 HISTORY — DX: Gastro-esophageal reflux disease without esophagitis: K21.9

## 2019-08-13 LAB — CBC
HCT: 43.6 % (ref 39.0–52.0)
Hemoglobin: 15.8 g/dL (ref 13.0–17.0)
MCH: 29.8 pg (ref 26.0–34.0)
MCHC: 36.2 g/dL — ABNORMAL HIGH (ref 30.0–36.0)
MCV: 82.1 fL (ref 80.0–100.0)
Platelets: 273 10*3/uL (ref 150–400)
RBC: 5.31 MIL/uL (ref 4.22–5.81)
RDW: 12.2 % (ref 11.5–15.5)
WBC: 7.9 10*3/uL (ref 4.0–10.5)
nRBC: 0 % (ref 0.0–0.2)

## 2019-08-13 LAB — BASIC METABOLIC PANEL
Anion gap: 19 — ABNORMAL HIGH (ref 5–15)
BUN: 13 mg/dL (ref 6–20)
CO2: 15 mmol/L — ABNORMAL LOW (ref 22–32)
Calcium: 9.2 mg/dL (ref 8.9–10.3)
Chloride: 91 mmol/L — ABNORMAL LOW (ref 98–111)
Creatinine, Ser: 1 mg/dL (ref 0.61–1.24)
GFR calc Af Amer: >60 mL/min (ref >60)
GFR calc non Af Amer: >60 mL/min (ref >60)
Glucose, Bld: 873 mg/dL (ref 70–99)
Potassium: 4.6 mmol/L (ref 3.5–5.1)
Sodium: 125 mmol/L — ABNORMAL LOW (ref 135–145)

## 2019-08-13 LAB — TROPONIN I (HIGH SENSITIVITY): Troponin I (High Sensitivity): 4 ng/L (ref <18)

## 2019-08-13 LAB — BLOOD GAS, VENOUS
Acid-base deficit: 5.4 mmol/L — ABNORMAL HIGH (ref 0.0–2.0)
Bicarbonate: 21.6 mmol/L (ref 20.0–28.0)
O2 Saturation: 37.8 %
Patient temperature: 37
pCO2, Ven: 47 mmHg (ref 44.0–60.0)
pH, Ven: 7.27 (ref 7.250–7.430)
pO2, Ven: <31 mmHg — CL (ref 32.0–45.0)

## 2019-08-13 LAB — URINALYSIS, ROUTINE W REFLEX MICROSCOPIC
Bacteria, UA: NONE SEEN
Bilirubin Urine: NEGATIVE
Glucose, UA: >=500 mg/dL — AB
Hgb urine dipstick: NEGATIVE
Ketones, ur: 80 mg/dL — AB
Leukocytes,Ua: NEGATIVE
Nitrite: NEGATIVE
Protein, ur: NEGATIVE mg/dL
Specific Gravity, Urine: 1.032 — ABNORMAL HIGH (ref 1.005–1.030)
Squamous Epithelial / HPF: NONE SEEN (ref 0–5)
pH: 5 (ref 5.0–8.0)

## 2019-08-13 LAB — GLUCOSE, CAPILLARY
Glucose-Capillary: >600 mg/dL (ref 70–99)
Glucose-Capillary: 413 mg/dL — ABNORMAL HIGH (ref 70–99)

## 2019-08-13 MED ORDER — ENOXAPARIN SODIUM 40 MG/0.4ML ~~LOC~~ SOLN
40.0000 mg | SUBCUTANEOUS | Status: DC
  Filled 2019-08-13: qty 0.4

## 2019-08-13 MED ORDER — INSULIN REGULAR(HUMAN) IN NACL 100-0.9 UT/100ML-% IV SOLN
INTRAVENOUS | Status: DC
  Administered 2019-08-13: 3.5 [IU]/h via INTRAVENOUS
  Administered 2019-08-14: 6.4 [IU]/h via INTRAVENOUS
  Filled 2019-08-13 (×2): qty 100

## 2019-08-13 MED ORDER — SODIUM CHLORIDE 0.9 % IV BOLUS
1000.0000 mL | Freq: Once | INTRAVENOUS | Status: AC
  Administered 2019-08-13: 1000 mL via INTRAVENOUS

## 2019-08-13 MED ORDER — INSULIN REGULAR(HUMAN) IN NACL 100-0.9 UT/100ML-% IV SOLN: INTRAVENOUS | Status: DC

## 2019-08-13 MED ORDER — DEXTROSE-NACL 5-0.45 % IV SOLN: INTRAVENOUS | Status: DC

## 2019-08-13 MED ORDER — SODIUM CHLORIDE 0.9 % IV SOLN: INTRAVENOUS | Status: DC

## 2019-08-13 MED ORDER — POTASSIUM CHLORIDE 10 MEQ/100ML IV SOLN
10.0000 meq | INTRAVENOUS | Status: DC
  Filled 2019-08-13 (×2): qty 100

## 2019-08-13 MED ORDER — SODIUM CHLORIDE 0.9 % IV SOLN
INTRAVENOUS | Status: DC
  Administered 2019-08-14: 01:00:00 via INTRAVENOUS

## 2019-08-13 MED ORDER — POTASSIUM CHLORIDE 10 MEQ/100ML IV SOLN
10.0000 meq | INTRAVENOUS | Status: AC
  Administered 2019-08-14 (×2): 10 meq via INTRAVENOUS
  Filled 2019-08-13 (×2): qty 100

## 2019-08-13 MED ORDER — DEXTROSE-NACL 5-0.45 % IV SOLN
INTRAVENOUS | Status: DC
  Administered 2019-08-14: 06:00:00 via INTRAVENOUS

## 2019-08-13 NOTE — ED Triage Notes (Signed)
Pt c/o burning central chest pressure with nausea. Has tried pepcid and alkaseltzer with no relief.

## 2019-08-13 NOTE — H&P (Signed)
History and Physical    Willie Delacruz VWU:[id number] DOB: 11-Apr-1991 DOA: 08/13/2019  PCP: Patient, No Pcp Per  Patient coming from: Home  I have personally briefly reviewed patient's old medical records in [facility]  Chief Complaint: chest pain, nausea  HPI: Willie Delacruz is a 28 y.o. male with unknown medical history who presents with symptoms of feeling dehydrated.  For the past week, patient has noticed that he has felt very thirsty and has been drinking more water and juice.  Has had polyuria and needing to get up at night to urinate.  Also noticed that his throat felt very hot and he has been trying to drink ice cold drinks to help with that.  About 2 to 3 days ago he also noticed anterior chest pain and heartburn symptoms as well as nausea.  He started to take Pepto-Bismol, Aleve and Tums and then noticed black stools but denies any bright red blood per rectum.  He denies any abdominal pain.  Denies any shortness of breath.   Patient reports that he has not seen a physician for many years.   Endorses about 1 cigarette every few days.  Daily alcohol use of 1 beer and more on the weekends. Denies illicit drug use. Family history includes mother who has type 2 diabetes.  ED Course: He was afebrile and normotensive on room air.  No leukocytosis or anemia.  Sodium of 125.  Potassium of 4.6.  Glucose of 875, bicarb of 15, anion gap of 19 and pH of 7.27. Troponin of 4. CXR negative.   Review of Systems: Constitutional: No Weight Change, No Fever ENT/Mouth: No sore throat, No Rhinorrhea Eyes: No Eye Pain, No Vision Changes Cardiovascular: + Chest Pain, no SOB Respiratory: No Cough, No Sputum, No Wheezing, no Dyspnea  Gastrointestinal: + Nausea, No Vomiting, No Diarrhea, No Constipation, No Pain Genitourinary: no dysuria  Musculoskeletal: No Arthralgias, No Myalgias Skin: No Skin Lesions, No Pruritus, Neuro: no Weakness, No Numbness,  No Loss of Consciousness, No Syncope Psych: No  Anxiety/Panic, No Depression, no decrease appetite Heme/Lymph: No Bruising, No Bleeding   Past Medical History:  Diagnosis Date  . Acid reflux     Past Surgical History:  Procedure Laterality Date  . FINGER SURGERY     per pt     reports that he has been smoking cigarettes. He has been smoking about 0.50 packs per day. He has never used smokeless tobacco. He reports current alcohol use of about 2.0 standard drinks of alcohol per week. He reports that he does not use drugs.  No Known Allergies    Prior to Admission medications   Medication Sig Start Date End Date Taking? Authorizing Provider  cyclobenzaprine (FLEXERIL) 5 MG tablet Take 1 tablet (5 mg total) by mouth 3 (three) times daily as needed for muscle spasms. Patient not taking: Reported on 08/13/2019 04/19/19   Loleta RoseForbach, Cory, MD    Physical Exam: Vitals:   08/13/19 1704 08/13/19 1706 08/13/19 2131  BP: (!) 140/92  (!) 148/107  Pulse: (!) 108  93  Resp: 16  16  Temp: 99.1 F (37.3 C)  98.3 F (36.8 C)  TempSrc: Oral  Oral  SpO2: 95%  97%  Weight:  108.9 kg   Height:  5\' 6"  (1.676 m)     Constitutional: NAD, calm, comfortable, well appearing young male sitting in chair beside bed. Vitals:   08/13/19 1704 08/13/19 1706 08/13/19 2131  BP: (!) 140/92  (!) 148/107  Pulse: (!) 108  93  Resp: 16  16  Temp: 99.1 F (37.3 C)  98.3 F (36.8 C)  TempSrc: Oral  Oral  SpO2: 95%  97%  Weight:  108.9 kg   Height:  5\' 6"  (1.676 m)    Eyes: PERRL, lids and conjunctivae normal ENMT: Mucous membranes are moist. Posterior pharynx clear of any exudate or lesions. Neck: normal, supple, no masses. Respiratory: clear to auscultation bilaterally, no wheezing, no crackles. Normal respiratory effort. No accessory muscle use.  Cardiovascular: Regular rate and rhythm, no murmurs / rubs / gallops. No extremity edema. 2+ pedal pulses.  Abdomen: no tenderness, no masses palpated.Bowel sounds positive.  Musculoskeletal: no clubbing /  cyanosis. No joint deformity upper and lower extremities. Good ROM, no contractures. Normal muscle tone.  Skin: no rashes, lesions, ulcers. No induration Neurologic: CN 2-12 grossly intact. Sensation intact. Strength 5/5 in all 4.  Psychiatric: Normal judgment and insight. Alert and oriented x 3. Normal mood.     Labs on Admission: I have personally reviewed following labs and imaging studies  CBC: Recent Labs  Lab 08/13/19 1715  WBC 7.9  HGB 15.8  HCT 43.6  MCV 82.1  PLT 273   Basic Metabolic Panel: Recent Labs  Lab 08/13/19 1715  NA 125*  K 4.6  CL 91*  CO2 15*  GLUCOSE 873*  BUN 13  CREATININE 1.00  CALCIUM 9.2   GFR: Estimated Creatinine Clearance: 125 mL/min (by C-G formula based on SCr of 1 mg/dL). Liver Function Tests: No results for input(s): AST, ALT, ALKPHOS, BILITOT, PROT, ALBUMIN in the last 168 hours. No results for input(s): LIPASE, AMYLASE in the last 168 hours. No results for input(s): AMMONIA in the last 168 hours. Coagulation Profile: No results for input(s): INR, PROTIME in the last 168 hours. Cardiac Enzymes: No results for input(s): CKTOTAL, CKMB, CKMBINDEX, TROPONINI in the last 168 hours. BNP (last 3 results) No results for input(s): PROBNP in the last 8760 hours. HbA1C: No results for input(s): HGBA1C in the last 72 hours. CBG: Recent Labs  Lab 08/13/19 1856 08/13/19 2249  GLUCAP >600* 413*   Lipid Profile: No results for input(s): CHOL, HDL, LDLCALC, TRIG, CHOLHDL, LDLDIRECT in the last 72 hours. Thyroid Function Tests: No results for input(s): TSH, T4TOTAL, FREET4, T3FREE, THYROIDAB in the last 72 hours. Anemia Panel: No results for input(s): VITAMINB12, FOLATE, FERRITIN, TIBC, IRON, RETICCTPCT in the last 72 hours. Urine analysis:    Component Value Date/Time   COLORURINE COLORLESS (A) 08/13/2019 1858   APPEARANCEUR CLEAR (A) 08/13/2019 1858   LABSPEC 1.032 (H) 08/13/2019 1858   PHURINE 5.0 08/13/2019 1858   GLUCOSEU >=500  (A) 08/13/2019 1858   HGBUR NEGATIVE 08/13/2019 1858   BILIRUBINUR NEGATIVE 08/13/2019 1858   KETONESUR 80 (A) 08/13/2019 1858   PROTEINUR NEGATIVE 08/13/2019 1858   NITRITE NEGATIVE 08/13/2019 1858   LEUKOCYTESUR NEGATIVE 08/13/2019 1858    Radiological Exams on Admission: Dg Chest 2 View  Result Date: 08/13/2019 CLINICAL DATA:  Shortness of breath and central chest pain EXAM: CHEST - 2 VIEW COMPARISON:  None. FINDINGS: The heart size and mediastinal contours are within normal limits. Both lungs are clear. The visualized skeletal structures are unremarkable. IMPRESSION: No active cardiopulmonary disease. Electronically Signed   By: 13/06/2019 M.D.   On: 08/13/2019 17:33    EKG: Independently reviewed.   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  Suspect that he has gone undiagnosed for quite some time leading up to his DKA. -Will check hemoglobin A1c - replete potassium - continue insulin gtt with goal of 140-180 and AG <12 - IV NS until BG <250, then switch to D5 1/2 NS  - BMP q4hr  - keep NPO - consult diabetic coordinator for diabetic education  Melena -Suspect secondary to patient being on Pepto-Bismol -Patient's hemoglobin is stable at 15.8  Chest pain -Initial troponin of 4 and normal sinus EKG -Patient endorses eating spicy foods so likely secondary to GERD  DVT prophylaxis:Lovenox Code Status:Full Family Communication: Plan discussed with patient at bedside  disposition Plan: Home with at least 2 midnight stays  Consults called:  Admission status: inpatient  Aylan Bayona T Joua Bake DO [facility]   If 7PM-7AM, please contact night-coverage [email or web address] Password [email or web address]  08/13/2019, 11:22 PM

## 2019-08-13 NOTE — ED Provider Notes (Signed)
[facility] Emergency Department Provider Note   ____________________________________________   First MD Initiated Contact with Patient 08/13/19 2204     (approximate)  I have reviewed the triage vital signs and the nursing notes.   HISTORY  Chief Complaint Abdominal Pain and Chest Pain    HPI Willie Delacruz is a 28 y.o. male reports no major past medical history  Presents today as he is noticed a burning feeling and some nausea over the last couple of days.  He reports that he is also had a lot of thirst, he does drink a lot of apple juice yesterday and has been urinating frequently as well  No fevers or chills.  No known exposure to coronavirus  Denies history of diabetes.  Denies heavy chest pressure.  Reports of burning feels like acid in his stomach  No abdominal pain.  No diarrhea.   Family history diabetes  Past Medical History:  Diagnosis Date  . Acid reflux     There are no active problems to display for this patient.   Past Surgical History:  Procedure Laterality Date  . FINGER SURGERY     per pt    Prior to Admission medications   Medication Sig Start Date End Date Taking? Authorizing Provider  cyclobenzaprine (FLEXERIL) 5 MG tablet Take 1 tablet (5 mg total) by mouth 3 (three) times daily as needed for muscle spasms. Patient not taking: Reported on 08/13/2019 04/19/19   Loleta RoseForbach, Cory, MD    Allergies Patient has no known allergies.  History reviewed. No pertinent family history.  Social History Social History   Tobacco Use  . Smoking status: Current Some Day Smoker    Packs/day: 0.50    Types: Cigarettes  . Smokeless tobacco: Never Used  Substance Use Topics  . Alcohol use: Yes    Alcohol/week: 2.0 standard drinks    Types: 2 Cans of beer per week  . Drug use: No    Review of Systems Constitutional: No fever/chills Eyes: No visual changes. ENT: No sore throat. Cardiovascular: See HPI Respiratory: Denies  shortness of breath. Gastrointestinal: No abdominal pain.  Some nausea no increased thirst Genitourinary: Negative for dysuria.  Urinating frequently Musculoskeletal: Negative for back pain. Skin: Negative for rash. Neurological: Negative for headaches, areas of focal weakness or numbness.    ____________________________________________   PHYSICAL EXAM:  VITAL SIGNS: ED Triage Vitals  Enc Vitals Group     BP 08/13/19 1704 (!) 140/92     Pulse Rate 08/13/19 1704 (!) 108     Resp 08/13/19 1704 16     Temp 08/13/19 1704 99.1 F (37.3 C)     Temp Source 08/13/19 1704 Oral     SpO2 08/13/19 1704 95 %     Weight 08/13/19 1706 240 lb (108.9 kg)     Height 08/13/19 1706 5\' 6"  (1.676 m)     Head Circumference --      Peak Flow --      Pain Score 08/13/19 1705 5     Pain Loc --      Pain Edu? --      Excl. in GC? --     Constitutional: Alert and oriented. Well appearing and in no acute distress.  Ambulatory no distress. Eyes: Conjunctivae are normal. Head: Atraumatic. Nose: No congestion/rhinnorhea. Mouth/Throat: Mucous membranes are moist. Neck: No stridor.  Cardiovascular: Normal rate, regular rhythm. Grossly normal heart sounds.  Good peripheral circulation. Respiratory: Normal respiratory effort.  No retractions. Lungs  CTAB. Gastrointestinal: Soft and nontender. No distention. Musculoskeletal: No lower extremity tenderness nor edema. Neurologic:  Normal speech and language. No gross focal neurologic deficits are appreciated.  Skin:  Skin is warm, dry and intact. No rash noted. Psychiatric: Mood and affect are normal. Speech and behavior are normal.  ____________________________________________   LABS (all labs ordered are listed, but only abnormal results are displayed)  Labs Reviewed  BASIC METABOLIC PANEL - Abnormal; Notable for the following components:      Result Value   Sodium 125 (*)    Chloride 91 (*)    CO2 15 (*)    Glucose, Bld 873 (*)    Anion gap 19  (*)    All other components within normal limits  CBC - Abnormal; Notable for the following components:   MCHC 36.2 (*)    All other components within normal limits  BLOOD GAS, VENOUS - Abnormal; Notable for the following components:   pO2, Ven <31.0 (*)    Acid-base deficit 5.4 (*)    All other components within normal limits  URINALYSIS, ROUTINE W REFLEX MICROSCOPIC - Abnormal; Notable for the following components:   Color, Urine COLORLESS (*)    APPearance CLEAR (*)    Specific Gravity, Urine 1.032 (*)    Glucose, UA >=500 (*)    Ketones, ur 80 (*)    All other components within normal limits  GLUCOSE, CAPILLARY - Abnormal; Notable for the following components:   Glucose-Capillary >600 (*)    All other components within normal limits  SARS CORONAVIRUS 2 (TAT 6-24 HRS)  CBG MONITORING, ED  TROPONIN I (HIGH SENSITIVITY)   ____________________________________________  EKG  Reviewed and interpreted by me at 1712 Heart rate 110 QRS 85 QTc 450 Sinus tachycardia without ischemia ____________________________________________  RADIOLOGY  Dg Chest 2 View  Result Date: 08/13/2019 CLINICAL DATA:  Shortness of breath and central chest pain EXAM: CHEST - 2 VIEW COMPARISON:  None. FINDINGS: The heart size and mediastinal contours are within normal limits. Both lungs are clear. The visualized skeletal structures are unremarkable. IMPRESSION: No active cardiopulmonary disease. Electronically Signed   By: Constance Holster M.D.   On: 08/13/2019 17:33    Chest x-ray reviewed negative for acute ____________________________________________   PROCEDURES  Procedure(s) performed: None  Procedures  Critical Care performed: Yes, see critical care note(s)  CRITICAL CARE Performed by: Delman Kitten   Total critical care time: 30 minutes  Critical care time was exclusive of separately billable procedures and treating other patients.  Critical care was necessary to treat or prevent  imminent or life-threatening deterioration.  Critical care was time spent personally by me on the following activities: development of treatment plan with patient and/or surrogate as well as nursing, discussions with consultants, evaluation of patient's response to treatment, examination of patient, obtaining history from patient or surrogate, ordering and performing treatments and interventions, ordering and review of laboratory studies, ordering and review of radiographic studies, pulse oximetry and re-evaluation of patient's condition.  ____________________________________________   INITIAL IMPRESSION / ASSESSMENT AND PLAN / ED COURSE  Pertinent labs & imaging results that were available during my care of the patient were reviewed by me and considered in my medical decision making (see chart for details).   Presents for evaluation of burning in his chest, polyuria polydipsia.  Patient noted to have severe hyperglycemia with low CO2.  Minimal acidosis, he has notable however low bicarbonate and with a glucose greater than 600 family history and the clinical history  suspect early diabetic ketoacidosis.  He does not have a history of diabetes.  Does have a family history  Denies any acute infectious symptoms.  Chest burning and discomfort and some nausea, but EKG and troponin reassuring as well as chest x-ray.  Willie Delacruz was evaluated in Emergency Department on 08/13/2019 for the symptoms described in the history of present illness. He was evaluated in the context of the global COVID-19 pandemic, which necessitated consideration that the patient might be at risk for infection with the SARS-CoV-2 virus that causes COVID-19. Institutional protocols and algorithms that pertain to the evaluation of patients at risk for COVID-19 are in a state of rapid change based on information released by regulatory bodies including the CDC and federal and state organizations. These policies and algorithms were  followed during the patient's care in the ED.  Discussed with the patient, his understanding agreeable with plan for admission as we continue to work-up and treat his new hyperglycemia, mild DKA.      ____________________________________________   FINAL CLINICAL IMPRESSION(S) / ED DIAGNOSES  Final diagnoses:  Hyperglycemia due to diabetes mellitus [facility])        Note:  This document was prepared using Dragon voice recognition software and may include unintentional dictation errors       Sharyn Creamer, MD 08/13/19 2223

## 2019-08-14 LAB — BASIC METABOLIC PANEL
Anion gap: 16 — ABNORMAL HIGH (ref 5–15)
Anion gap: 11 (ref 5–15)
BUN: 10 mg/dL (ref 6–20)
BUN: 9 mg/dL (ref 6–20)
CO2: 17 mmol/L — ABNORMAL LOW (ref 22–32)
CO2: 22 mmol/L (ref 22–32)
Calcium: 8.7 mg/dL — ABNORMAL LOW (ref 8.9–10.3)
Calcium: 8.8 mg/dL — ABNORMAL LOW (ref 8.9–10.3)
Chloride: 98 mmol/L (ref 98–111)
Chloride: 102 mmol/L (ref 98–111)
Creatinine, Ser: 0.78 mg/dL (ref 0.61–1.24)
Creatinine, Ser: 0.77 mg/dL (ref 0.61–1.24)
GFR calc Af Amer: >60 mL/min (ref >60)
GFR calc Af Amer: >60 mL/min (ref >60)
GFR calc non Af Amer: >60 mL/min (ref >60)
GFR calc non Af Amer: >60 mL/min (ref >60)
Glucose, Bld: 352 mg/dL — ABNORMAL HIGH (ref 70–99)
Glucose, Bld: 165 mg/dL — ABNORMAL HIGH (ref 70–99)
Potassium: 3.9 mmol/L (ref 3.5–5.1)
Potassium: 3.2 mmol/L — ABNORMAL LOW (ref 3.5–5.1)
Sodium: 131 mmol/L — ABNORMAL LOW (ref 135–145)
Sodium: 135 mmol/L (ref 135–145)

## 2019-08-14 LAB — GLUCOSE, CAPILLARY
Glucose-Capillary: 166 mg/dL — ABNORMAL HIGH (ref 70–99)
Glucose-Capillary: 174 mg/dL — ABNORMAL HIGH (ref 70–99)
Glucose-Capillary: 197 mg/dL — ABNORMAL HIGH (ref 70–99)
Glucose-Capillary: 233 mg/dL — ABNORMAL HIGH (ref 70–99)
Glucose-Capillary: 240 mg/dL — ABNORMAL HIGH (ref 70–99)
Glucose-Capillary: 271 mg/dL — ABNORMAL HIGH (ref 70–99)
Glucose-Capillary: 278 mg/dL — ABNORMAL HIGH (ref 70–99)
Glucose-Capillary: 281 mg/dL — ABNORMAL HIGH (ref 70–99)
Glucose-Capillary: 282 mg/dL — ABNORMAL HIGH (ref 70–99)
Glucose-Capillary: 310 mg/dL — ABNORMAL HIGH (ref 70–99)
Glucose-Capillary: 336 mg/dL — ABNORMAL HIGH (ref 70–99)
Glucose-Capillary: 370 mg/dL — ABNORMAL HIGH (ref 70–99)

## 2019-08-14 LAB — HEMOGLOBIN A1C
Hgb A1c MFr Bld: 10.5 % — ABNORMAL HIGH (ref 4.8–5.6)
Mean Plasma Glucose: 254.65 mg/dL

## 2019-08-14 LAB — SARS CORONAVIRUS 2 (TAT 6-24 HRS): SARS Coronavirus 2: NEGATIVE

## 2019-08-14 LAB — HIV ANTIBODY (ROUTINE TESTING W REFLEX): HIV Screen 4th Generation wRfx: NONREACTIVE

## 2019-08-14 MED ORDER — INSULIN ASPART 100 UNIT/ML ~~LOC~~ SOLN: 0.0000 [IU] | Freq: Three times a day (TID) | SUBCUTANEOUS | Status: DC

## 2019-08-14 MED ORDER — INSULIN GLARGINE 100 UNIT/ML ~~LOC~~ SOLN
20.0000 [IU] | Freq: Every day | SUBCUTANEOUS | Status: DC
  Administered 2019-08-14: 20 [IU] via SUBCUTANEOUS
  Filled 2019-08-14: qty 0.2

## 2019-08-14 MED ORDER — INSULIN ASPART 100 UNIT/ML ~~LOC~~ SOLN: 0.0000 [IU] | Freq: Every day | SUBCUTANEOUS | Status: DC

## 2019-08-14 MED ORDER — BLOOD GLUCOSE MONITOR KIT: PACK | 0 refills | Status: DC

## 2019-08-14 MED ORDER — METFORMIN HCL 500 MG PO TABS: 500.0000 mg | ORAL_TABLET | Freq: Two times a day (BID) | ORAL | Status: DC

## 2019-08-14 MED ORDER — METFORMIN HCL 500 MG PO TABS: 500.0000 mg | ORAL_TABLET | Freq: Two times a day (BID) | ORAL | 3 refills | Status: DC

## 2019-08-14 MED ORDER — INSULIN GLARGINE 100 UNIT/ML ~~LOC~~ SOLN: 20.0000 [IU] | Freq: Every day | SUBCUTANEOUS | 11 refills | Status: DC

## 2019-08-14 NOTE — ED Notes (Signed)
CBG 174  

## 2019-08-14 NOTE — Progress Notes (Signed)
Inpatient Diabetes Program Recommendations  AACE/ADA: New Consensus Statement on Inpatient Glycemic Control (2015)  Target Ranges:  Prepandial:   less than 140 mg/dL      Peak postprandial:   less than 180 mg/dL (1-2 hours)      Critically ill patients:  140 - 180 mg/dL   Results for RANDOLF, SANSOUCIE (MRN [id number]) as of 08/14/2019 07:58  Ref. Range 08/13/2019 17:15  Sodium Latest Ref Range: 135 - 145 mmol/L 125 (L)  Potassium Latest Ref Range: 3.5 - 5.1 mmol/L 4.6  Chloride Latest Ref Range: 98 - 111 mmol/L 91 (L)  CO2 Latest Ref Range: 22 - 32 mmol/L 15 (L)  Glucose Latest Ref Range: 70 - 99 mg/dL 873 (HH)  BUN Latest Ref Range: 6 - 20 mg/dL 13  Creatinine Latest Ref Range: 0.61 - 1.24 mg/dL 1.00  Calcium Latest Ref Range: 8.9 - 10.3 mg/dL 9.2  Anion gap Latest Ref Range: 5 - 15  19 (H)   Results for TETSUO, COPPOLA (MRN [id number]) as of 08/14/2019 07:58  Ref. Range 08/14/2019 00:35  Hemoglobin A1C Latest Ref Range: 4.8 - 5.6 % 10.5 (H)  (254 mg/dl)    Admit with: DKA/ Newly Diagnosed Diabetes/ CP   Current Orders: IV Insulin Drip     Lantus 20 units Daily     Novolog Sensitive Correction Scale/ SSI (0-9 units) TID AC + HS     Transitioning off the IV Insulin Drip this AM.  Family History of Diabetes--No medical care for several years per H&P note.  Will consult Care Management team to help patient find primary care after d/c.  Plan to order educational materials and will speak w/ pt once settled in room.    MD- Given patient was in DKA on admission, may benefit from insulin at time of d/c.  May also benefit from Metformin as well.  Please consider both at time of d/c.     --Will follow patient during hospitalization--  Wyn Quaker RN, MSN, CDE Diabetes Coordinator Inpatient Glycemic Control Team Team Pager: [phone] (8a-5p)

## 2019-08-14 NOTE — ED Notes (Signed)
Per Dr. Posey Pronto patient's medications and drips to be stopped at this time.  Plan for patient to be discharged from ED after prescriptions placed and discharge instructions placed.  Pt comfortable and denies needs at this time.  Will continue to monitor.

## 2019-08-14 NOTE — ED Notes (Signed)
CBG 166 

## 2019-08-14 NOTE — ED Notes (Signed)
Diabetes Coordinator states will be on the way to speak with patient in the room.

## 2019-08-14 NOTE — Discharge Instructions (Signed)
Please check your sugars at least three times a day and keep a log of it  Follow dietary instructions according to the education provided  Make sure you follow-up with primary care physician in the area. Try to make appointment with one of the free clinics in the area that has been provided by social worker

## 2019-08-14 NOTE — ED Notes (Signed)
CBG 233, Ashley,RN made aware.

## 2019-08-14 NOTE — ED Notes (Signed)
Pt provided breakfast tray; able to feed self. 

## 2019-08-14 NOTE — ED Notes (Signed)
Called pharmacy to send lovenox

## 2019-08-14 NOTE — Care Management (Signed)
ED RN CM outreached to medication management at request of MD. Being discharged home as new diabetic with no insurance and need for insulin pen. Dr. Posey Pronto,  MD will send e-scribe to pharmacy and [facility] team will advise patient to pick up medication.

## 2019-08-14 NOTE — ED Notes (Signed)
Warm blankets provided per pt request.

## 2019-08-14 NOTE — Discharge Summary (Signed)
[place] [facility] [place] NAME: Willie Delacruz    MR#:  [id number]  DATE OF BIRTH:  December 04, 1990  DATE OF ADMISSION:  08/13/2019 ADMITTING PHYSICIAN: No admitting provider for patient encounter.  DATE OF DISCHARGE: 08/14/2019  PRIMARY CARE PHYSICIAN: Patient, No Pcp Per    ADMISSION DIAGNOSIS:  epigastric and chest pain  DISCHARGE DIAGNOSIS:  DKA with type II diabetes--- new diagnosis  SECONDARY DIAGNOSIS:   Past Medical History:  Diagnosis Date  . Acid reflux     HOSPITAL COURSE:  Willie Delacruz is a 28 y.o. male with unknown medical history who presents with symptoms of feeling dehydrated.  For the past week, patient has noticed that he has felt very thirsty and has been drinking more water and juice.  Has had polyuria and needing to get up at night to urinate.  *Diabetic ketoacidosis secondary to undiagnosed diabetes-type 2 -Patient has not seen a physician for many years and has family history of diabetes.  Suspect that he has gone undiagnosed for quite some time leading up to his DKA. - hemoglobin A1c 10% - replete potassium - received Iv insulin gtt with goal of 140-180 and AG <12-- patient's anion gap close. He is on Lantus 10 units at bedtime and metformin 500 BID will start -she will get his medication through me to make medication management clinic -diabetes education provided -patient will follow-up with free clinic/open-door clinic in the area -deals lot better. He wants to go home.   *Chest pain--resolved - normal sinus EKG -Patient endorses eating spicy foods so likely secondary to GERD  *DVT prophylaxis:Lovenox  Discharge plan, diabetes education and dietary counseling done. Discussed with social worker in the ER to help patient find PCP in the area.  Medications will be provided through medication management clinic. Description for glucometer has been called in to the [facility] in [place]   discharge  home CONSULTS OBTAINED:    DRUG ALLERGIES:  No Known Allergies  DISCHARGE MEDICATIONS:   Allergies as of 08/14/2019   No Known Allergies     Medication List    STOP taking these medications   cyclobenzaprine 5 MG tablet Commonly known as: FLEXERIL     TAKE these medications   blood glucose meter kit and supplies Kit Dispense based on patient and insurance preference. Use up to four times daily as directed. (FOR ICD-9 250.00, 250.01).   insulin glargine 100 UNIT/ML injection Commonly known as: LANTUS Inject 0.2 mLs (20 Units total) into the skin daily. Start taking on: August 15, 2019   metFORMIN 500 MG tablet Commonly known as: GLUCOPHAGE Take 1 tablet (500 mg total) by mouth 2 (two) times daily with a meal.       If you experience worsening of your admission symptoms, develop shortness of breath, life threatening emergency, suicidal or homicidal thoughts you must seek medical attention immediately by calling 911 or calling your MD immediately  if symptoms less severe.  You Must read complete instructions/literature along with all the possible adverse reactions/side effects for all the Medicines you take and that have been prescribed to you. Take any new Medicines after you have completely understood and accept all the possible adverse reactions/side effects.   Please note  You were cared for by a hospitalist during your hospital stay. If you have any questions about your discharge medications or the care you received while you were in the hospital after you are discharged, you can call the unit and  asked to speak with the hospitalist on call if the hospitalist that took care of you is not available. Once you are discharged, your primary care physician will handle any further medical issues. Please note that NO REFILLS for any discharge medications will be authorized once you are discharged, as it is imperative that you return to your primary care physician (or establish a  relationship with a primary care physician if you do not have one) for your aftercare needs so that they can reassess your need for medications and monitor your lab values. Today   SUBJECTIVE   Tired from being in the ER however feels a lot better. Ate good breakfast.  VITAL SIGNS:  Blood pressure 132/86, pulse 71, temperature 98.3 F (36.8 C), temperature source Oral, resp. rate 18, height '5\' 6"'  (1.676 m), weight 108.9 kg, SpO2 97 %.  I/O:    Intake/Output Summary (Last 24 hours) at 08/14/2019 1316 Last data filed at 08/14/2019 1226 Gross per 24 hour  Intake 2104.16 ml  Output -  Net 2104.16 ml    PHYSICAL EXAMINATION:  GENERAL:  28 y.o.-year-old patient lying in the bed with no acute distress.  EYES: Pupils equal, round, reactive to light and accommodation. No scleral icterus. Extraocular muscles intact.  HEENT: Head atraumatic, normocephalic. Oropharynx and nasopharynx clear.  NECK:  Supple, no jugular venous distention. No thyroid enlargement, no tenderness.  LUNGS: Normal breath sounds bilaterally, no wheezing, rales,rhonchi or crepitation. No use of accessory muscles of respiration.  CARDIOVASCULAR: S1, S2 normal. No murmurs, rubs, or gallops.  ABDOMEN: Soft, non-tender, non-distended. Bowel sounds present. No organomegaly or mass.  EXTREMITIES: No pedal edema, cyanosis, or clubbing.  NEUROLOGIC: Cranial nerves II through XII are intact. Muscle strength 5/5 in all extremities. Sensation intact. Gait not checked.  PSYCHIATRIC: The patient is alert and oriented x 3.  SKIN: No obvious rash, lesion, or ulcer.   DATA REVIEW:   CBC  Recent Labs  Lab 08/13/19 1715  WBC 7.9  HGB 15.8  HCT 43.6  PLT 273    Chemistries  Recent Labs  Lab 08/14/19 0944  NA 135  K 3.2*  CL 102  CO2 22  GLUCOSE 165*  BUN 9  CREATININE 0.77  CALCIUM 8.8*    Microbiology Results   Recent Results (from the past 240 hour(s))  SARS CORONAVIRUS 2 (TAT 6-24 HRS) Nasopharyngeal  Nasopharyngeal Swab     Status: None   Collection Time: 08/13/19 10:52 PM   Specimen: Nasopharyngeal Swab  Result Value Ref Range Status   SARS Coronavirus 2 NEGATIVE NEGATIVE Final    Comment: (NOTE) SARS-CoV-2 target nucleic acids are NOT DETECTED. The SARS-CoV-2 RNA is generally detectable in upper and lower respiratory specimens during the acute phase of infection. Negative results do not preclude SARS-CoV-2 infection, do not rule out co-infections with other pathogens, and should not be used as the sole basis for treatment or other patient management decisions. Negative results must be combined with clinical observations, patient history, and epidemiological information. The expected result is Negative. Fact Sheet for Patients: SugarRoll.be Fact Sheet for Healthcare Providers: https://www.woods-mathews.com/ This test is not yet approved or cleared by the [place] FDA and  has been authorized for detection and/or diagnosis of SARS-CoV-2 by FDA under an Emergency Use Authorization (EUA). This EUA will remain  in effect (meaning this test can be used) for the duration of the COVID-19 declaration under Section 56 4(b)(1) of the Act, 21 U.S.C. section 360bbb-3(b)(1), unless the authorization is terminated or  revoked sooner. Performed at [facility], [place] [address] [place], [place] [address]     RADIOLOGY:  Dg Chest 2 View  Result Date: 08/13/2019 CLINICAL DATA:  Shortness of breath and central chest pain EXAM: CHEST - 2 VIEW COMPARISON:  None. FINDINGS: The heart size and mediastinal contours are within normal limits. Both lungs are clear. The visualized skeletal structures are unremarkable. IMPRESSION: No active cardiopulmonary disease. Electronically Signed   By: Constance Holster M.D.   On: 08/13/2019 17:33     CODE STATUS:     Code Status Orders  (From admission, onward)         Start     Ordered   08/13/19 2319   Full code  Continuous     08/13/19 2321        Code Status History    This patient has a current code status but no historical code status.   Advance Care Planning Activity      TOTAL TIME TAKING CARE OF THIS PATIENT: *40* minutes.    Fritzi Mandes M.D on 08/14/2019 at 1:16 PM  Between 7am to 6pm - Pager - [phone] After 6pm go to [email or web address] - password [email or web address]  [facility]    CC: Primary care physician; Patient, No Pcp Per

## 2019-08-14 NOTE — Progress Notes (Signed)
Met w/ pt prior to d/c from the ED.  New diagnosis of DM presenting with DKA.  Spoke with pt and wife about new diagnosis.  Discussed A1C results with them and explained what an A1C is, basic pathophysiology of DM Type 2, basic home care, basic diabetes diet nutrition principles, importance of checking CBGs and maintaining good CBG control to prevent long-term and short-term complications.  Reviewed signs and symptoms of hyperglycemia and hypoglycemia and how to treat hypoglycemia at home.  Also reviewed blood sugar goals and A1c goals for home.    Gave pt and wife several educational pamphlets on various aspects of diabetes care.    Also discussed DM diet information with patient and wife.  Encouraged patient to avoid beverages with sugar/carbohydrates (regular soda, sweet tea, lemonade, fruit juice) and to consume mostly water.  Discussed what foods contain carbohydrates and how carbohydrates affect the body's blood sugar levels.  Encouraged patient to be careful with his portion sizes (especially grains, starchy vegetables, and fruits).    Educated patient and spouse on insulin pen use at home.  Reviewed all steps of insulin pen including attachment of needle, 2-unit air shot, dialing up dose, giving injection, rotation of injection sites, removing needle, disposal of sharps, storage of unused insulin, disposal of insulin etc.  Patient able to provide successful return demonstration.  Reviewed troubleshooting with insulin pen.    Also discussed w/ pt that Dr. Posey Pronto will be giving him Rxs for Basglar Insulin and Metformin BID for home use.  Explained what these two meds, how they work , how to take, side effects, when to take, etc.    Patient and wife very open too all info provided and appreciative of visit.  Gave pt and wife info on buying low cost CBG meter OTC at [facility] and other DM supplies (glucose tablets, alcohol swabs, insulin pen needles, etc).  Strongly encouraged pt to seek follow up  care with one of the low cost clinics to help him manage his diabetes at home.    --Will follow patient during hospitalization--  Wyn Quaker RN, MSN, CDE Diabetes Coordinator Inpatient Glycemic Control Team Team Pager: [phone] (8a-5p)

## 2019-10-11 ENCOUNTER — Telehealth: Payer: Self-pay | Admitting: Pharmacy Technician

## 2019-10-11 NOTE — Telephone Encounter (Signed)
Patient failed to provide requested 2020 financial documentation. No additional medication assistance will be provided by [facility] without the required proof of income documentation. Patient notified by letter  Nahzir Pohle CPhT [facility] 

## 2019-10-19 ENCOUNTER — Telehealth: Payer: Self-pay | Admitting: Pharmacy Technician

## 2019-10-19 NOTE — Telephone Encounter (Signed)
Patient failed to provide 2019 taxes and unemployment statement.  No additional medication assistance will be provided by [facility] without the required proof of income documentation.  Patient notified by letter.  Sherilyn Dacosta Care Manager [facility]

## 2019-10-22 ENCOUNTER — Other Ambulatory Visit: Payer: Self-pay

## 2019-11-15 ENCOUNTER — Telehealth: Payer: Self-pay | Admitting: Pharmacy Technician

## 2019-11-16 ENCOUNTER — Ambulatory Visit: Payer: Self-pay | Admitting: Pharmacy Technician

## 2019-11-16 ENCOUNTER — Other Ambulatory Visit: Payer: Self-pay

## 2019-11-16 DIAGNOSIS — Z79899 Other long term (current) drug therapy: Secondary | ICD-10-CM

## 2019-11-16 NOTE — Progress Notes (Addendum)
Provided patient with financial assistance application for [facility] due to recent hospital visit.  Patient agreed to be responsible for gathering financial information and forwarding to appropriate department in [facility].    Completed [facility] application and contract.  Patient agreed to all terms of the [facility] contract.  Patient approved to receive medication assistance at [facility] on a temporary basis.  Patient will need to provide 2020 tax return by 02/02/20 in order to continue to receive medication assistance.    Provided patient with Community education officer based on his particular needs.    Sherilyn Dacosta Care Manager [facility]

## 2019-12-24 ENCOUNTER — Other Ambulatory Visit: Payer: Self-pay

## 2020-01-22 IMAGING — CR DG KNEE COMPLETE 4+V*L*
1 series · 4 of 4 positions shown · non-contrast
Comparison: None.

CLINICAL DATA: Pain following fall approximately 1 week prior

EXAM:
LEFT KNEE - COMPLETE 4+ VIEW

[Series 1: dg knee complete 4 views left · 0.14mm/px · 4 of 4 slices shown]
[im 1/4]
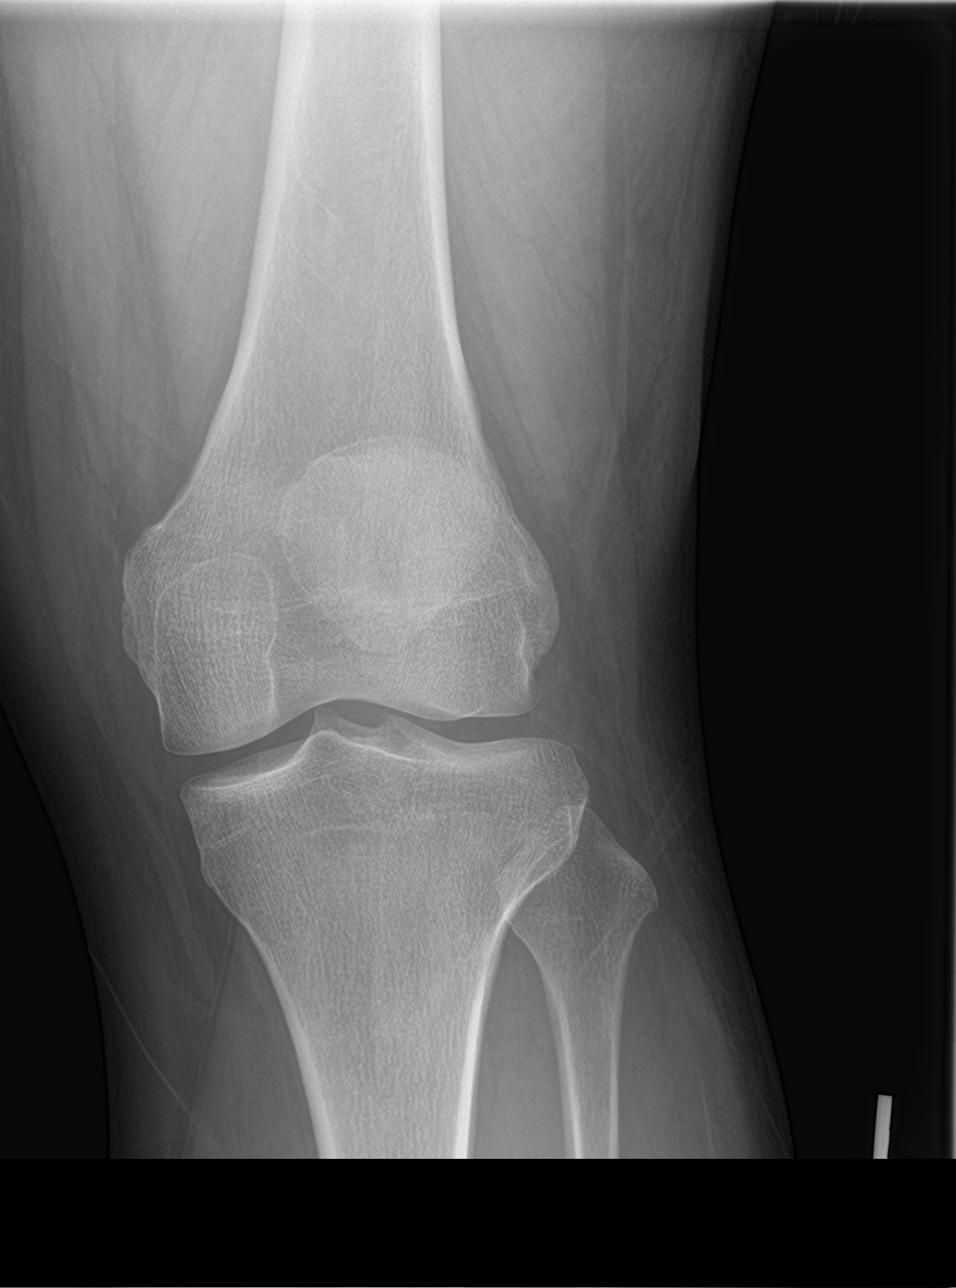
[im 2/4]
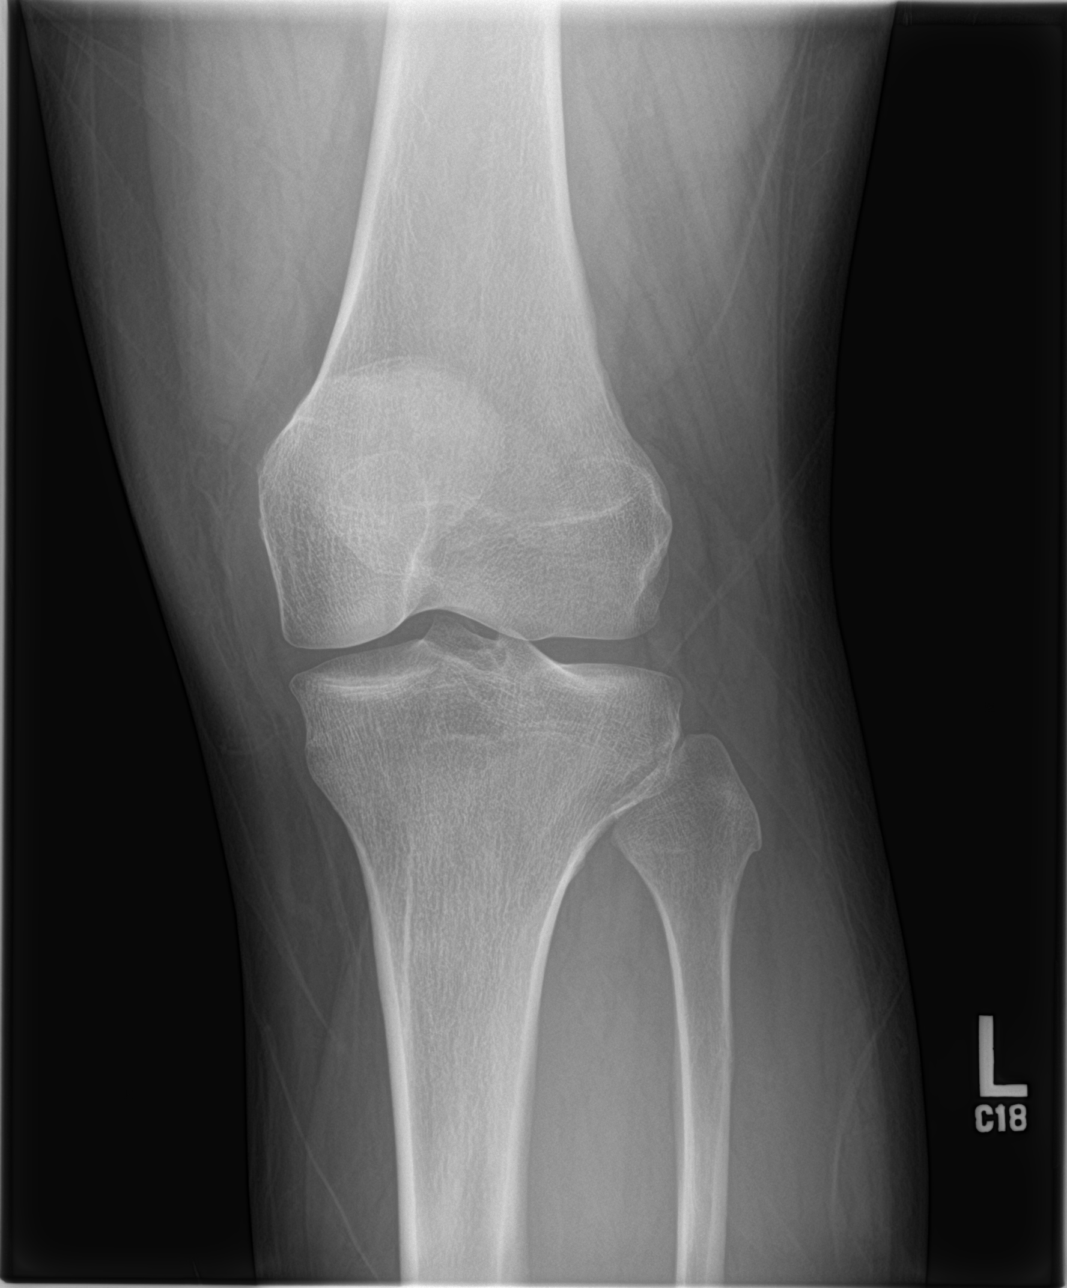
[im 3/4]
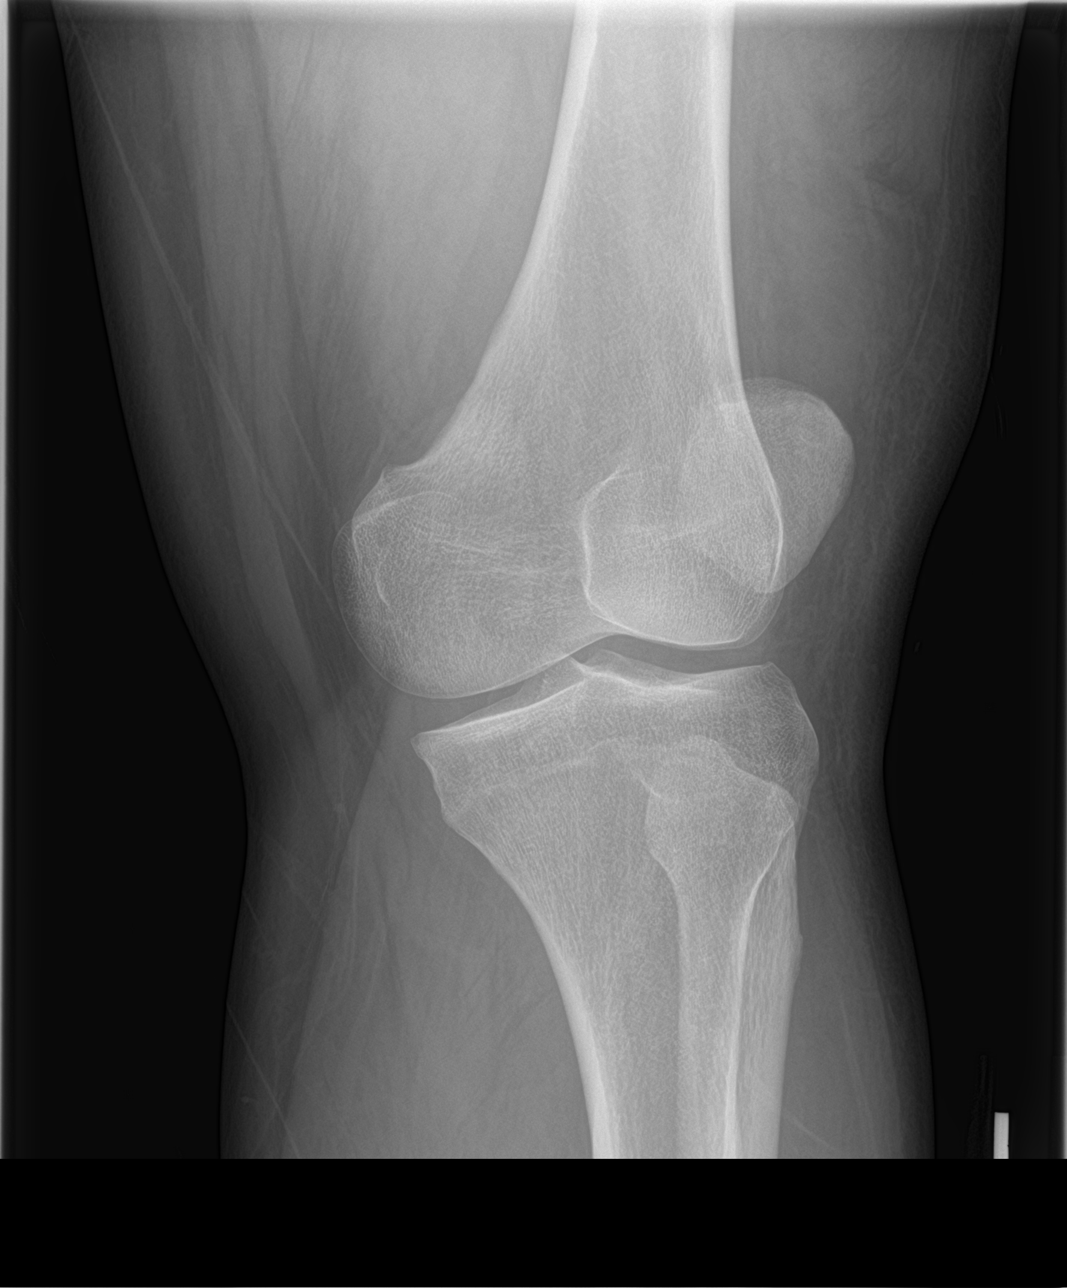
[im 4/4]
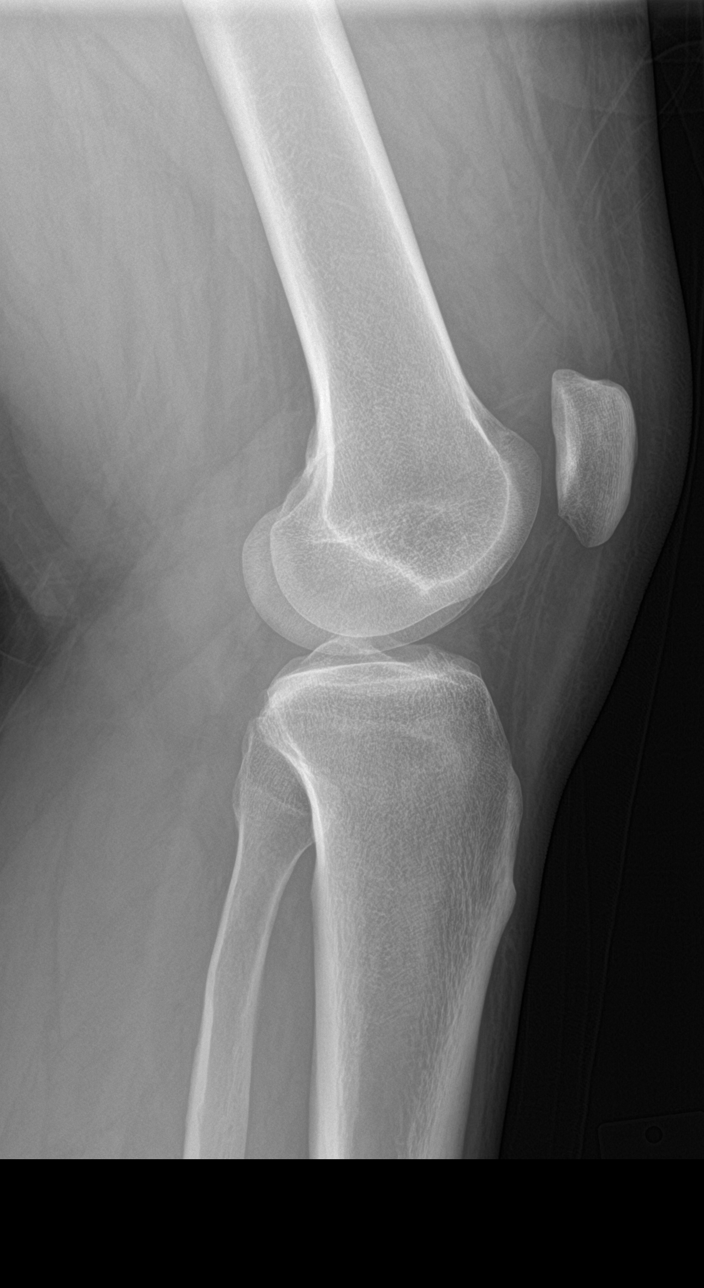

[4 of 4 positions shown; findings below may reference images not displayed]

FINDINGS: Frontal, lateral, and bilateral oblique views were obtained. There
is no fracture or dislocation. There is no appreciable joint
effusion. Joint spaces appear normal. No erosive change.
IMPRESSION: No fracture or dislocation. No appreciable joint effusion. No
appreciable arthropathy.

## 2020-01-29 ENCOUNTER — Encounter: Payer: Self-pay | Admitting: Emergency Medicine

## 2020-01-29 ENCOUNTER — Other Ambulatory Visit: Payer: Self-pay

## 2020-01-29 ENCOUNTER — Emergency Department
Admission: EM | Admit: 2020-01-29 | Discharge: 2020-01-29 | Disposition: A | Discharge status: Home / Self Care | Payer: Self-pay | Attending: Emergency Medicine | Admitting: Emergency Medicine

## 2020-01-29 DIAGNOSIS — F4325 Adjustment disorder with mixed disturbance of emotions and conduct: Secondary | ICD-10-CM | POA: Insufficient documentation

## 2020-01-29 DIAGNOSIS — F4329 Adjustment disorder with other symptoms: Secondary | ICD-10-CM | POA: Diagnosis present

## 2020-01-29 DIAGNOSIS — F10929 Alcohol use, unspecified with intoxication, unspecified: Secondary | ICD-10-CM | POA: Insufficient documentation

## 2020-01-29 DIAGNOSIS — Z79899 Other long term (current) drug therapy: Secondary | ICD-10-CM | POA: Insufficient documentation

## 2020-01-29 DIAGNOSIS — R7401 Elevation of levels of liver transaminase levels: Secondary | ICD-10-CM | POA: Insufficient documentation

## 2020-01-29 DIAGNOSIS — Z794 Long term (current) use of insulin: Secondary | ICD-10-CM | POA: Insufficient documentation

## 2020-01-29 DIAGNOSIS — F10959 Alcohol use, unspecified with alcohol-induced psychotic disorder, unspecified: Secondary | ICD-10-CM | POA: Diagnosis present

## 2020-01-29 DIAGNOSIS — Y908 Blood alcohol level of 240 mg/100 ml or more: Secondary | ICD-10-CM | POA: Insufficient documentation

## 2020-01-29 DIAGNOSIS — F17210 Nicotine dependence, cigarettes, uncomplicated: Secondary | ICD-10-CM | POA: Insufficient documentation

## 2020-01-29 LAB — COMPREHENSIVE METABOLIC PANEL
ALT: 236 U/L — ABNORMAL HIGH (ref 0–44)
AST: 119 U/L — ABNORMAL HIGH (ref 15–41)
Albumin: 4.7 g/dL (ref 3.5–5.0)
Alkaline Phosphatase: 85 U/L (ref 38–126)
Anion gap: 11 (ref 5–15)
BUN: 6 mg/dL (ref 6–20)
CO2: 21 mmol/L — ABNORMAL LOW (ref 22–32)
Calcium: 8.6 mg/dL — ABNORMAL LOW (ref 8.9–10.3)
Chloride: 109 mmol/L (ref 98–111)
Creatinine, Ser: 0.64 mg/dL (ref 0.61–1.24)
GFR calc Af Amer: >60 mL/min (ref >60)
GFR calc non Af Amer: >60 mL/min (ref >60)
Glucose, Bld: 105 mg/dL — ABNORMAL HIGH (ref 70–99)
Potassium: 4 mmol/L (ref 3.5–5.1)
Sodium: 141 mmol/L (ref 135–145)
Total Bilirubin: 0.6 mg/dL (ref 0.3–1.2)
Total Protein: 8.7 g/dL — ABNORMAL HIGH (ref 6.5–8.1)

## 2020-01-29 LAB — URINE DRUG SCREEN, QUALITATIVE (ARMC ONLY)
Amphetamines, Ur Screen: NOT DETECTED
Barbiturates, Ur Screen: NOT DETECTED
Benzodiazepine, Ur Scrn: NOT DETECTED
Cannabinoid 50 Ng, Ur ~~LOC~~: NOT DETECTED
Cocaine Metabolite,Ur ~~LOC~~: NOT DETECTED
MDMA (Ecstasy)Ur Screen: NOT DETECTED
Methadone Scn, Ur: NOT DETECTED
Opiate, Ur Screen: NOT DETECTED
Phencyclidine (PCP) Ur S: NOT DETECTED
Tricyclic, Ur Screen: NOT DETECTED

## 2020-01-29 LAB — CBC
HCT: 47.2 % (ref 39.0–52.0)
Hemoglobin: 16.6 g/dL (ref 13.0–17.0)
MCH: 30.6 pg (ref 26.0–34.0)
MCHC: 35.2 g/dL (ref 30.0–36.0)
MCV: 87.1 fL (ref 80.0–100.0)
Platelets: 287 10*3/uL (ref 150–400)
RBC: 5.42 MIL/uL (ref 4.22–5.81)
RDW: 13.1 % (ref 11.5–15.5)
WBC: 8.4 10*3/uL (ref 4.0–10.5)
nRBC: 0 % (ref 0.0–0.2)

## 2020-01-29 LAB — SALICYLATE LEVEL: Salicylate Lvl: <7 mg/dL — ABNORMAL LOW (ref 7.0–30.0)

## 2020-01-29 LAB — ACETAMINOPHEN LEVEL: Acetaminophen (Tylenol), Serum: <10 ug/mL — ABNORMAL LOW (ref 10–30)

## 2020-01-29 LAB — ETHANOL: Alcohol, Ethyl (B): 313 mg/dL (ref <10)

## 2020-01-29 NOTE — BH Assessment (Signed)
Assessment Note  Willie Delacruz is an 29 y.o. male who presented to [facility] ED under IVC. Per pt triage note he presented stating to be unsure of why he is here right now. Pt denied any SI/HI, appeared calm and cooperative. IVC papers stated Pt has been drinking daily for the past week and making threats to jump out a moving car. Pt had also stated he wanted to barricade himself in and blow something up. Pt was also, heard making the statement that he wanted to die.   During TTS assessment today, Pt presented calm, cooperative with a coherent thought process. Pt denied the information provided in IVC and stated feeling he was here due to his wife current stressors (loss job & car) and the pressures of being around each other all day due to her current stressors and COVID19. Pt stated "I understand my wife's frustrations but we just need someone to talk to about our problems". During further assessment of their problems, pt stated "normal martial stuff like arguing and bickering over small things" but did not provide specifics. Pt denied drinking daily and reported his last drink before last night to be over a month ago. Pt confirmed drinking 3 beers last night but denied consistent use. Pt expressed feeling his only problem are the arguments with his wife. Pt denied a history of substance abuse and alcohol dependency. Pt denied any MH diagnosis and previous inpatient or outpatient treatment. Pt reports taking medicine for diabetes and nothing more. Pt denies any SI/HI or hallucinations. Pt was able to confirm his safety and agreed to follow up with substance abuse resources provided.   Collateral contact made with Pt wife Suzette Battiest, [phone]): TTS consulted with wife while present with Pt. Suzette Battiest identified her main concerns to be pt's drinking habits and needs for support. Suzette Battiest denied concerns for Pt current safety and agreed to follow up with resources provided.   Per Dr. Cindy Hazy, Pt will be  discharged with the recommendation to follow up with resources provided.   Diagnosis: Alcohol-Induced Psychosis   Past Medical History:  Past Medical History:  Diagnosis Date  . Acid reflux     Past Surgical History:  Procedure Laterality Date  . FINGER SURGERY     per pt    Family History: No family history on file.  Social History:  reports that he has been smoking cigarettes. He has been smoking about 0.50 packs per day. He has never used smokeless tobacco. He reports current alcohol use of about 2.0 standard drinks of alcohol per week. He reports that he does not use drugs.  Additional Social History:  Alcohol / Drug Use Pain Medications: see mar Prescriptions: see mar Over the Counter: see mar History of alcohol / drug use?: Yes Substance #1 Name of Substance 1: Alcohol 1 - Amount (size/oz): 2-3 beers 1 - Frequency: occassionally 1 - Last Use / Amount: Last night/ 3 beers  CIWA: CIWA-Ar BP: 101/68 Pulse Rate: 66 COWS:    Allergies: No Known Allergies  Home Medications: (Not in a hospital admission)   OB/GYN Status:  No LMP for male patient.  General Assessment Data Assessment unable to be completed: Yes Reason for not completing assessment: BAL Level Location of Assessment: [facility] ED TTS Assessment: In system Is this a Tele or Face-to-Face Assessment?: Face-to-Face Is this an Initial Assessment or a Re-assessment for this encounter?: Initial Assessment Patient Accompanied by:: Adult(Wife) Permission Given to speak with another: Yes Name, Relationship and Phone Number: Suzette Battiest,  Wife, [phone] Language Other than English: No Living Arrangements: Other (Comment)(Private Home ) What gender do you identify as?: Male Marital status: Married Jordan name: (Unknown ) Pregnancy Status: No Living Arrangements: Spouse/significant other, Children Can pt return to current living arrangement?: Yes Admission Status: Involuntary Petitioner: Family member(Wife) Is  patient capable of signing voluntary admission?: Yes Referral Source: Self/Family/Friend Insurance type: None   Medical Screening Exam [facility] Walk-in ONLY) Medical Exam completed: Yes  Crisis Care Plan Living Arrangements: Spouse/significant other, Children  Education Status Is patient currently in school?: No  Risk to self with the past 6 months Suicidal Ideation: No Has patient been a risk to self within the past 6 months prior to admission? : No Suicidal Intent: No Has patient had any suicidal intent within the past 6 months prior to admission? : No Is patient at risk for suicide?: No Suicidal Plan?: No Has patient had any suicidal plan within the past 6 months prior to admission? : No Access to Means: No What has been your use of drugs/alcohol within the last 12 months?: Alcohol  Previous Attempts/Gestures: No How many times?: 0 Other Self Harm Risks: None reported  Triggers for Past Attempts: Unknown Intentional Self Injurious Behavior: None Family Suicide History: Unknown(None reported ) Recent stressful life event(s): Conflict (Comment)(arguments with wife ) Persecutory voices/beliefs?: No Depression: No Depression Symptoms: (None reported) Substance abuse history and/or treatment for substance abuse?: Yes(Wife reports history, Pt denies ) Suicide prevention information given to non-admitted patients: Yes  Risk to Others within the past 6 months Homicidal Ideation: No Does patient have any lifetime risk of violence toward others beyond the six months prior to admission? : No Thoughts of Harm to Others: No Current Homicidal Intent: No Current Homicidal Plan: No Access to Homicidal Means: No History of harm to others?: No Assessment of Violence: None Noted Does patient have access to weapons?: No(None reported ) Criminal Charges Pending?: No Does patient have a court date: No Is patient on probation?: No  Psychosis Hallucinations: None noted Delusions: None  noted  Mental Status Report Appearance/Hygiene: In scrubs Eye Contact: Good Motor Activity: Unremarkable Speech: Logical/coherent Level of Consciousness: Alert Mood: Anxious Affect: Anxious, Appropriate to circumstance Anxiety Level: Minimal Thought Processes: Coherent Judgement: Partial Orientation: Person, Place, Time, Situation Obsessive Compulsive Thoughts/Behaviors: None  Cognitive Functioning Concentration: Good Memory: Recent Intact, Remote Intact Is patient IDD: No Insight: Good Impulse Control: Good Appetite: Good Have you had any weight changes? : No Change Sleep: No Change Total Hours of Sleep: 8 Vegetative Symptoms: None  ADLScreening [facility] Assessment Services) Patient's cognitive ability adequate to safely complete daily activities?: Yes Patient able to express need for assistance with ADLs?: Yes Independently performs ADLs?: Yes (appropriate for developmental age)  Prior Inpatient Therapy Prior Inpatient Therapy: No  Prior Outpatient Therapy Prior Outpatient Therapy: No Does patient have an ACCT team?: No Does patient have Intensive In-House Services?  : No Does patient have [facility] services? : No Does patient have P4CC services?: No  ADL Screening (condition at time of admission) Patient's cognitive ability adequate to safely complete daily activities?: Yes Is the patient deaf or have difficulty hearing?: No Does the patient have difficulty seeing, even when wearing glasses/contacts?: No Does the patient have difficulty concentrating, remembering, or making decisions?: No Patient able to express need for assistance with ADLs?: Yes Does the patient have difficulty dressing or bathing?: No Independently performs ADLs?: Yes (appropriate for developmental age) Does the patient have difficulty walking or climbing stairs?: No Weakness  of Legs: None Weakness of Arms/Hands: None  Home Assistive Devices/Equipment Home Assistive Devices/Equipment:  None  Therapy Consults (therapy consults require a physician order) PT Evaluation Needed: No OT Evalulation Needed: No SLP Evaluation Needed: No Abuse/Neglect Assessment (Assessment to be complete while patient is alone) Abuse/Neglect Assessment Can Be Completed: Yes Physical Abuse: Denies Verbal Abuse: Denies Sexual Abuse: Denies Exploitation of patient/patient's resources: Denies Self-Neglect: Denies Values / Beliefs Cultural Requests During Hospitalization: None Spiritual Requests During Hospitalization: None Consults Spiritual Care Consult Needed: No Transition of Care Team Consult Needed: No            Disposition:  Disposition Initial Assessment Completed for this Encounter: Yes Patient referred to: Other (Comment)(Resources provided )  On Site Evaluation by:   Reviewed with Physician:    Shanon Ace 01/29/2020 12:03 PM

## 2020-01-29 NOTE — ED Notes (Signed)
Pt discharged home.  VS stable. Discharge instructions reviewed with patient.  TTS supplied resources for both the pt and his wife.  Pt denies SI/HI. All belongings returned to pt.

## 2020-01-29 NOTE — ED Provider Notes (Signed)
[facility] Emergency Department Provider Note  ____________________________________________  Time seen: Approximately 4:49 AM  I have reviewed the triage vital signs and the nursing notes.   HISTORY  Chief Complaint Mental Health Problem   HPI Willie Delacruz is a 29 y.o. male with a history of diabetes who presents under IVC taken out by his girlfriend for suicidal thoughts.  According to the papers patient has been drinking heavily for the past week and has been making threats to jump out of a moving car or barricading himself and blowing the room up.  Patient is clearly intoxicated.  He denies making any threats to kill himself or others.  He denies feeling depressed.  He denies suicidal or homicidal thoughts.   Patient reports that he has no idea why his girlfriend would say things like that.  He denies any drug use.  He very hesitant to talk about his alcohol use and reports "I do not drink heavily but I was drinking tonight."  Past Medical History:  Diagnosis Date  . Acid reflux     Patient Active Problem List   Diagnosis Date Noted  . Alcohol-induced psychosis ([place]) 01/29/2020  . 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 DKA (diabetic ketoacidoses) ([place]) 08/13/2019    Past Surgical History:  Procedure Laterality Date  . FINGER SURGERY     per pt    Prior to Admission medications   Medication Sig Start Date End Date Taking? Authorizing Provider  blood glucose meter kit and supplies KIT Dispense based on patient and insurance preference. Use up to four times daily as directed. (FOR ICD-9 250.00, 250.01). 08/14/19   Fritzi Mandes, MD  insulin glargine (LANTUS) 100 UNIT/ML injection Inject 0.2 mLs (20 Units total) into the skin daily. 08/15/19   Fritzi Mandes, MD  metFORMIN (GLUCOPHAGE) 500 MG tablet Take 1 tablet (500 mg total) by mouth 2 (two) times daily with a meal. 08/14/19   Fritzi Mandes, MD    Allergies Patient has no known  allergies.  No family history on file.  Social History Social History   Tobacco Use  . Smoking status: Current Some Day Smoker    Packs/day: 0.50    Types: Cigarettes  . Smokeless tobacco: Never Used  Substance Use Topics  . Alcohol use: Yes    Alcohol/week: 2.0 standard drinks    Types: 2 Cans of beer per week  . Drug use: No    Review of Systems  Constitutional: Negative for fever. Eyes: Negative for visual changes. ENT: Negative for sore throat. Neck: No neck pain  Cardiovascular: Negative for chest pain. Respiratory: Negative for shortness of breath. Gastrointestinal: Negative for abdominal pain, vomiting or diarrhea. Genitourinary: Negative for dysuria. Musculoskeletal: Negative for back pain. Skin: Negative for rash. Neurological: Negative for headaches, weakness or numbness. Psych: No SI or HI  ____________________________________________   PHYSICAL EXAM:  VITAL SIGNS: ED Triage Vitals  Enc Vitals Group     BP 01/29/20 0123 123/90     Pulse Rate 01/29/20 0123 79     Resp 01/29/20 0123 18     Temp 01/29/20 0123 98 F (36.7 C)     Temp Source 01/29/20 0123 Oral     SpO2 01/29/20 0123 97 %     Weight 01/29/20 0124 220 lb (99.8 kg)     Height 01/29/20 0124 '5\' 6"'  (1.676 m)     Head Circumference --      Peak Flow --  Pain Score 01/29/20 0124 0     Pain Loc --      Pain Edu? --      Excl. in [place]? --     Constitutional: Alert and oriented. Well appearing and in no apparent distress. Intoxicated HEENT:      Head: Normocephalic and atraumatic.         Eyes: Conjunctivae are normal. Sclera is non-icteric.       Mouth/Throat: Mucous membranes are moist.       Neck: Supple with no signs of meningismus. Cardiovascular: Regular rate and rhythm.  Respiratory: Normal respiratory effort.  Gastrointestinal: Soft, non tender, and non distended. Musculoskeletal: No edema, cyanosis, or erythema of extremities. Neurologic: Normal speech and language. Face is  symmetric. Moving all extremities. No gross focal neurologic deficits are appreciated. Skin: Skin is warm, dry and intact. No rash noted. Psychiatric: Mood and affect are normal. Speech and behavior are normal.  ____________________________________________   LABS (all labs ordered are listed, but only abnormal results are displayed)  Labs Reviewed  COMPREHENSIVE METABOLIC PANEL - Abnormal; Notable for the following components:      Result Value   CO2 21 (*)    Glucose, Bld 105 (*)    Calcium 8.6 (*)    Total Protein 8.7 (*)    AST 119 (*)    ALT 236 (*)    All other components within normal limits  ETHANOL - Abnormal; Notable for the following components:   Alcohol, Ethyl (B) 313 (*)    All other components within normal limits  SALICYLATE LEVEL - Abnormal; Notable for the following components:   Salicylate Lvl <1.2 (*)    All other components within normal limits  ACETAMINOPHEN LEVEL - Abnormal; Notable for the following components:   Acetaminophen (Tylenol), Serum <10 (*)    All other components within normal limits  CBC  URINE DRUG SCREEN, QUALITATIVE ([facility] ONLY)   ____________________________________________  EKG  none  ____________________________________________  RADIOLOGY  none  ____________________________________________   PROCEDURES  Procedure(s) performed: None Procedures Critical Care performed:  None ____________________________________________   INITIAL IMPRESSION / ASSESSMENT AND PLAN / ED COURSE  29 y.o. male with a history of diabetes who presents under IVC taken out by his girlfriend for suicidal thoughts and heavy EtOH use.  Patient denies all statements in the IVC paper.  He is currently intoxicated.  Has been evaluated by psychiatry who recommended keeping patient under IVC in the emergency department to his sobers up.  At that point they will reevaluate patient.  His alcohol level is 313.  I told patient about elevated LFTs which are  concerning for liver injury in the setting of alcohol use.  Highly encourage patient to stop drinking to prevent cirrhosis.  Provided patient with information and counseling about the dangers of alcohol and cirrhosis.  No evidence of DKA.  Drug screen negative.  Patient is clear for psych disposition.  Old medical records reviewed.  The patient has been placed in psychiatric observation due to the need to provide a safe environment for the patient while obtaining psychiatric consultation and evaluation, as well as ongoing medical and medication management to treat the patient's condition.  The patient has been placed under full IVC at this time.       Please note:  Patient was evaluated in Emergency Department today for the symptoms described in the history of present illness. Patient was evaluated in the context of the global COVID-19 pandemic, which necessitated consideration that the  patient might be at risk for infection with the SARS-CoV-2 virus that causes COVID-19. Institutional protocols and algorithms that pertain to the evaluation of patients at risk for COVID-19 are in a state of rapid change based on information released by regulatory bodies including the CDC and federal and state organizations. These policies and algorithms were followed during the patient's care in the ED.  Some ED evaluations and interventions may be delayed as a result of limited staffing during the pandemic.   ____________________________________________   FINAL CLINICAL IMPRESSION(S) / ED DIAGNOSES   Final diagnoses:  Alcoholic intoxication with complication ([place])  Transaminitis      NEW MEDICATIONS STARTED DURING THIS VISIT:  ED Discharge Orders    None       Note:  This document was prepared using Dragon voice recognition software and may include unintentional dictation errors.    Alfred Levins, [place], MD 01/29/20 [phone]

## 2020-01-29 NOTE — Discharge Instructions (Addendum)
Your liver enzymes are already altered due to your heavy drinking. We recommend that you stop using alcohol to prevent further liver injury or development of cirrhosis. Follow up with your doctor to have your liver evaluated within the next week.   Follow-up with your regular doctor and psychiatrist.    Return to the ER for new or recurrent thoughts of suicide, any other thoughts of wanting to hurt yourself or anyone else, or any other new or worsening symptoms that concern you.

## 2020-01-29 NOTE — ED Notes (Addendum)
Dr Don Perking made aware at this time of pt's critically elevated ETOH level as reported by lab. ETOH 313 mg/dL

## 2020-01-29 NOTE — ED Provider Notes (Signed)
1:09 PM Patient has been seen and evaluated by psychiatry team and deemed appropriate for discharge. Patient determined not to be a threat to themselves or others. IVC rescinded by psychiatry team. Patient is otherwise medically clear and stable for discharge.   Patient is clinically sober - ambulatory with steady gait and has tolerated PO in the ED.      Miguel Aschoff., MD 01/29/20 1329

## 2020-01-29 NOTE — ED Triage Notes (Signed)
Pt presents to ED accompanied by [employer]. Pt states he is unsure of why he is here right now. IVC papers have been taken out by pt girlfriend. Pt currently denies SI or HI. Appears calm and cooperative.  IVC papers state pt has been drinking daily for the past week and has been making threats to jump out a moving car. Pt had also stated that he wanted to barricade himself in and blow something up. Pt was heard making the statement that he wanted to die.

## 2020-01-29 NOTE — ED Notes (Signed)
Pt BAL is significantly elevated ( BAL 313). Pt will be assessed when she is no longer intoxicated.

## 2020-01-29 NOTE — Consult Note (Signed)
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 male patient admitted with history of dangerous behavior  My interview with the patient yielded little useful information.  He denied that anything significant happened or that he had any symptoms at this point overall he is quite pleasant and easy to talk to and appears mostly oblivious to why he would need to be in the emergency room.  To clarify the history I asked that his fiance come into the emergency room and I talked to her alone to obtain more information.  She describes quite a different situation where the patient is an extremely heavy drinker and although when he is not drunk is able to work on several jobs typically he will start on a drinking binge and require alcohol however he can even by walking if necessary to the store.  Once he starts drinking this rarely stops.  He has not engaged in outpatient treatment and she has not engaged in Al-Anon nor has her 3 children engaged in [place].  She typically tries to cover up for his drunken state.  However this time she decided to call the authorities.  When he heard this he apparently tried to hide outside the house but eventually passed out and was found that by the authorities and brought in subsequently.  She describes the past history where if he is in the car he will try and get out of the car while it still moving.  All of the dangerous behavior occurs in an intoxicated state and when he is sober he has no symptoms or complaints and no evidence of any risk of harming himself or others.  Her history matches what we see in the  emergency department whereas now that he is sober there are [place] no symptoms and no insight as well as no suicidality or lethality of any form.   Subjective per evaluation this am:  Willie Delacruz is a 29 y.o. male patient presented to [facility] ED via law enforcement under involuntary commitment status (IVC).  Per the ED triage nursing note, the patient stated that he is unsure why he is here.  It was reported that the patient's girlfriend/wife took out IVC papers on him.  Per the IVC paperwork, the patient has been drinking daily for the past week and has been making threats to jump out of a moving car.  It reports the patient had wanted to barricade himself and blow something up.  Per the triage nurse's note, the patient was heard making a statement that he wanted to die.  The patient denies all of this information when presented to him during his assessment.  The patient stated, "because of COVID we have been getting on each other's nerves."  He says, "my wife lost her job, I have been home.  I think we have been getting to each other, which it is causing [place] to argue more than usual." The patient ETOH level is 313 mg/dl and the patient UDS is negative.   The patient was seen face-to-face by this provider; chart reviewed and consulted with Dr. Don Perking on 01/29/2020 due to the patient's care. It was discussed with the EDP that the  patient would remain under observation overnight and reassess in the a.m. to determine if he meets the criteria for psychiatric inpatient admission or could be discharged back home.  Collateral will need to be obtained from the patient's girlfriend/wife to understand better why he is here.   On evaluation, the patient is alert and oriented x 4, intoxicated (which he stated he was a little tipsy) but calm, cooperative, and mood-congruent with affect.  The patient does not appear to be responding to internal or external stimuli. Neither is the patient presenting with any delusional  thinking. The patient denies auditory or visual hallucinations. The patient denies suicidal, homicidal, or self-harm ideations. The patient is not presenting with any psychotic or paranoid behaviors. During an encounter with the patient, he was able to participate and answer questions appropriately.  Past Psychiatric History:  Risk to Self:   Risk to Others:   Prior Inpatient Therapy:   Prior Outpatient Therapy:    Past Medical History:  Past Medical History:  Diagnosis Date  . Acid reflux     Past Surgical History:  Procedure Laterality Date  . FINGER SURGERY     per pt   Family History: No family history on file. Family Psychiatric  History: Social History:  Social History   Substance and Sexual Activity  Alcohol Use Yes  . Alcohol/week: 2.0 standard drinks  . Types: 2 Cans of beer per week     Social History   Substance and Sexual Activity  Drug Use No    Social History   Socioeconomic History  . Marital status: Single    Spouse name: Not on file  . Number of children: Not on file  . Years of education: Not on file  . Highest education level: Not on file  Occupational History  . Not on file  Tobacco Use  . Smoking status: Current Some Day Smoker    Packs/day: 0.50    Types: Cigarettes  . Smokeless tobacco: Never Used  Substance and Sexual Activity  . Alcohol use: Yes    Alcohol/week: 2.0 standard drinks    Types: 2 Cans of beer per week  . Drug use: No  . Sexual activity: Not on file  Other Topics Concern  . Not on file  Social History Narrative  . Not on file   Social Determinants of Health   Financial Resource Strain:   . Difficulty of Paying Living Expenses:   Food Insecurity:   . Worried About Charity fundraiser in the Last Year:   . Arboriculturist in the Last Year:   Transportation Needs:   . Film/video editor (Medical):   Marland Kitchen Lack of Transportation (Non-Medical):   Physical Activity:   . Days of Exercise per Week:   . Minutes of  Exercise per Session:   Stress:   . Feeling of Stress :   Social Connections:   . Frequency of Communication with Friends and Family:   . Frequency of Social Gatherings with Friends and Family:   . Attends Religious Services:   . Active Member of Clubs or Organizations:   . Attends Archivist Meetings:   Marland Kitchen Marital Status:    Additional Social History:    Allergies:  No Known Allergies  Labs:  Results for orders placed or performed during the hospital encounter of 01/29/20 (from the past 48 hour(s))  Comprehensive metabolic panel     Status: Abnormal   Collection Time: 01/29/20  1:31 AM  Result  Value Ref Range   Sodium 141 135 - 145 mmol/L   Potassium 4.0 3.5 - 5.1 mmol/L   Chloride 109 98 - 111 mmol/L   CO2 21 (L) 22 - 32 mmol/L   Glucose, Bld 105 (H) 70 - 99 mg/dL    Comment: Glucose reference range applies only to samples taken after fasting for at least 8 hours.   BUN 6 6 - 20 mg/dL   Creatinine, Ser 7.06 0.61 - 1.24 mg/dL   Calcium 8.6 (L) 8.9 - 10.3 mg/dL   Total Protein 8.7 (H) 6.5 - 8.1 g/dL   Albumin 4.7 3.5 - 5.0 g/dL   AST 237 (H) 15 - 41 U/L   ALT 236 (H) 0 - 44 U/L   Alkaline Phosphatase 85 38 - 126 U/L   Total Bilirubin 0.6 0.3 - 1.2 mg/dL   GFR calc non Af Amer >60 >60 mL/min   GFR calc Af Amer >60 >60 mL/min   Anion gap 11 5 - 15    Comment: Performed at [facility], [address] [place], [place] [address]  Ethanol     Status: Abnormal   Collection Time: 01/29/20  1:31 AM  Result Value Ref Range   Alcohol, Ethyl (B) 313 (HH) <10 mg/dL    Comment: CRITICAL RESULT CALLED TO, READ BACK BY AND VERIFIED WITH BUTCH WOODS AT 0241 ON 01/29/20 RWW (NOTE) Lowest detectable limit for serum alcohol is 10 mg/dL. For medical purposes only. Performed at [facility], [address] [place], [place] [address]   Salicylate level     Status: Abnormal   Collection Time: 01/29/20  1:31 AM  Result Value Ref Range   Salicylate Lvl <7.0  (L) 7.0 - 30.0 mg/dL    Comment: Performed at [facility], [address] [place], [place] [address]  Acetaminophen level     Status: Abnormal   Collection Time: 01/29/20  1:31 AM  Result Value Ref Range   Acetaminophen (Tylenol), Serum <10 (L) 10 - 30 ug/mL    Comment: (NOTE) Therapeutic concentrations vary significantly. A range of 10-30 ug/mL  may be an effective concentration for many patients. However, some  are best treated at concentrations outside of this range. Acetaminophen concentrations >150 ug/mL at 4 hours after ingestion  and >50 ug/mL at 12 hours after ingestion are often associated with  toxic reactions. Performed at [facility], [address] [place], [place] [address]   cbc     Status: None   Collection Time: 01/29/20  1:31 AM  Result Value Ref Range   WBC 8.4 4.0 - 10.5 K/uL   RBC 5.42 4.22 - 5.81 MIL/uL   Hemoglobin 16.6 13.0 - 17.0 g/dL   HCT 94.8 54.6 - 27.0 %   MCV 87.1 80.0 - 100.0 fL   MCH 30.6 26.0 - 34.0 pg   MCHC 35.2 30.0 - 36.0 g/dL   RDW 35.0 09.3 - 81.8 %   Platelets 287 150 - 400 K/uL   nRBC 0.0 0.0 - 0.2 %    Comment: Performed at [facility], [address], [place], [place] [address]  Urine Drug Screen, Qualitative     Status: None   Collection Time: 01/29/20  1:31 AM  Result Value Ref Range   Tricyclic, Ur Screen NONE DETECTED NONE DETECTED   Amphetamines, Ur Screen NONE DETECTED NONE DETECTED   MDMA (Ecstasy)Ur Screen NONE DETECTED NONE DETECTED   Cocaine Metabolite,Ur  NONE DETECTED NONE DETECTED  Opiate, Ur Screen NONE DETECTED NONE DETECTED   Phencyclidine (PCP) Ur S NONE DETECTED NONE DETECTED   Cannabinoid 50 Ng, Ur [place] NONE DETECTED NONE DETECTED   Barbiturates, Ur Screen NONE DETECTED NONE DETECTED   Benzodiazepine, Ur Scrn NONE DETECTED NONE DETECTED   Methadone Scn, Ur NONE DETECTED NONE DETECTED    Comment: (NOTE) Tricyclics + metabolites, urine    Cutoff 1000 ng/mL Amphetamines +  metabolites, urine  Cutoff 1000 ng/mL MDMA (Ecstasy), urine              Cutoff 500 ng/mL Cocaine Metabolite, urine          Cutoff 300 ng/mL Opiate + metabolites, urine        Cutoff 300 ng/mL Phencyclidine (PCP), urine         Cutoff 25 ng/mL Cannabinoid, urine                 Cutoff 50 ng/mL Barbiturates + metabolites, urine  Cutoff 200 ng/mL Benzodiazepine, urine              Cutoff 200 ng/mL Methadone, urine                   Cutoff 300 ng/mL The urine drug screen provides only a preliminary, unconfirmed analytical test result and should not be used for non-medical purposes. Clinical consideration and professional judgment should be applied to any positive drug screen result due to possible interfering substances. A more specific alternate chemical method must be used in order to obtain a confirmed analytical result. Gas chromatography / mass spectrometry (GC/MS) is the preferred confirmat ory method. Performed at [facility], [address] [place], [place] [address]     No current facility-administered medications for this encounter.   Current Outpatient Medications  Medication Sig Dispense Refill  . insulin glargine (LANTUS) 100 UNIT/ML injection Inject 0.2 mLs (20 Units total) into the skin daily. 10 mL 11  . metFORMIN (GLUCOPHAGE) 500 MG tablet Take 1 tablet (500 mg total) by mouth 2 (two) times daily with a meal. 60 tablet 3     Psychiatric Specialty Exam: Physical Exam  Review of Systems  Blood pressure 101/68, pulse 66, temperature 97.8 F (36.6 C), temperature source Oral, resp. rate 17, height 5\' 6"  (1.676 m), weight 99.8 kg, SpO2 97 %.Body mass index is 35.51 kg/m.  General Appearance: Well Groomed  Eye Contact:  Good  Speech:  Clear and Coherent  Volume:  Normal  Mood:  Euthymic  Affect:  Congruent  Thought Process:  Coherent and Goal Directed  Orientation:  Full (Time, Place, and Person)  Thought Content:  Logical  Suicidal Thoughts:  No   Homicidal Thoughts:  No  Memory:  Immediate;   Good  Judgement:  Impaired  Insight:  Lacking  Psychomotor Activity:  Normal  Concentration:  Concentration: Good  Recall:  Good  Fund of Knowledge:  Good  Language:  Good  Akathisia:  No  Handed:  Ambidextrous  AIMS (if indicated):     Assets:  Physical Health Social Support Talents/Skills Transportation  ADL's:  Intact  Cognition:  WNL  Sleep:        Treatment Plan Summary: The patient is currently without any medical problems and there are no symptoms to address.  He requires outpatient assistance with his alcohol use disorder however he expresses little interest in actually pursuing such care  Disposition: Patient does not meet criteria for psychiatric inpatient admission.   I engaged the team to  communicate with the patient's fianc to expand her support through local resources as well as Al-Anon.  I also recommended that she access [employer] for support of her 3 children as one of them has already expressed concern over his father's behavior.  I also recommended strongly to the patient that he seek assistance through AA and other resources based on the fact that his liver enzymes showed that he is already done some damage to his liver.  At this point in his care he has gone several hours without any evidence of lethality and thus there is no reason to further continue his IVC.  I thus rescinded his IVC and he was to be discharged to home via his fiance.  Cindy HazyBradley Aundra Espin, MD 01/29/2020 12:00 PM

## 2020-01-29 NOTE — Consult Note (Signed)
[place] [facility] Consult   Reason for Consult: IVC Referring Physician: Dr. 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  I did not do nothing wrong." Willie Delacruz is a 29 y.o. male patient presented to [facility] ED via law enforcement under involuntary commitment status (IVC).  Per the ED triage nursing note, the patient stated that he is unsure why he is here.  It was reported that the patient's girlfriend/wife took out IVC papers on him.  Per the IVC paperwork, the patient has been drinking daily for the past week and has been making threats to jump out of a moving car.  It reports the patient had wanted to barricade himself and blow something up.  Per the triage nurse's note, the patient was heard making a statement that he wanted to die.  The patient denies all of this information when presented to him during his assessment.  The patient stated, "because of COVID we have been getting on each other's nerves."  He says, "my wife lost her job, I have been home.  I think we have been getting to each other, which it is causing [place] to argue more than usual." The patient ETOH level is 313 mg/dl and the patient UDS is negative.  The patient was seen face-to-face by this provider; chart reviewed and consulted with Dr. Alfred Levins on 01/29/2020 due to the patient's care. It was discussed with the EDP that the patient would remain under observation overnight and reassess in the a.m. to determine if he meets the criteria for psychiatric inpatient admission or could be discharged back home.  Collateral will need to be obtained from the patient's girlfriend/wife to understand better why he is here.  On evaluation, the patient is alert and oriented  x 4, intoxicated (which he stated he was a little tipsy) but calm, cooperative, and mood-congruent with affect.  The patient does not appear to be responding to internal or external stimuli. Neither is the patient presenting with any delusional thinking. The patient denies auditory or visual hallucinations. The patient denies suicidal, homicidal, or self-harm ideations. The patient is not presenting with any psychotic or paranoid behaviors. During an encounter with the patient, he was able to participate and answer questions appropriately.   Plan: The patient will remain under observation overnight and reassess in the a.m. to determine if he meets the criteria for psychiatric inpatient admission or could be discharged back home.  Collateral will need to be obtained from the patient's girlfriend/wife to understand better why he is here.   HPI:    Past Psychiatric History:   Risk to Self:   No Risk to Others:   No Prior Inpatient Therapy:   None Prior Outpatient Therapy:   None  Past Medical History:  Past Medical History:  Diagnosis Date  . Acid reflux     Past Surgical History:  Procedure Laterality Date  . FINGER SURGERY     per pt   Family History: No family history on file. Family Psychiatric  History: None Social History:  Social History   Substance and Sexual Activity  Alcohol Use Yes  . Alcohol/week: 2.0 standard drinks  . Types: 2 Cans of beer per week     Social History   Substance and  Sexual Activity  Drug Use No    Social History   Socioeconomic History  . Marital status: Single    Spouse name: Not on file  . Number of children: Not on file  . Years of education: Not on file  . Highest education level: Not on file  Occupational History  . Not on file  Tobacco Use  . Smoking status: Current Some Day Smoker    Packs/day: 0.50    Types: Cigarettes  . Smokeless tobacco: Never Used  Substance and Sexual Activity  . Alcohol use: Yes    Alcohol/week: 2.0  standard drinks    Types: 2 Cans of beer per week  . Drug use: No  . Sexual activity: Not on file  Other Topics Concern  . Not on file  Social History Narrative  . Not on file   Social Determinants of Health   Financial Resource Strain:   . Difficulty of Paying Living Expenses:   Food Insecurity:   . Worried About Charity fundraiser in the Last Year:   . Arboriculturist in the Last Year:   Transportation Needs:   . Film/video editor (Medical):   Marland Kitchen Lack of Transportation (Non-Medical):   Physical Activity:   . Days of Exercise per Week:   . Minutes of Exercise per Session:   Stress:   . Feeling of Stress :   Social Connections:   . Frequency of Communication with Friends and Family:   . Frequency of Social Gatherings with Friends and Family:   . Attends Religious Services:   . Active Member of Clubs or Organizations:   . Attends Archivist Meetings:   Marland Kitchen Marital Status:    Additional Social History:    Allergies:  No Known Allergies  Labs:  Results for orders placed or performed during the hospital encounter of 01/29/20 (from the past 48 hour(s))  Comprehensive metabolic panel     Status: Abnormal   Collection Time: 01/29/20  1:31 AM  Result Value Ref Range   Sodium 141 135 - 145 mmol/L   Potassium 4.0 3.5 - 5.1 mmol/L   Chloride 109 98 - 111 mmol/L   CO2 21 (L) 22 - 32 mmol/L   Glucose, Bld 105 (H) 70 - 99 mg/dL    Comment: Glucose reference range applies only to samples taken after fasting for at least 8 hours.   BUN 6 6 - 20 mg/dL   Creatinine, Ser 0.64 0.61 - 1.24 mg/dL   Calcium 8.6 (L) 8.9 - 10.3 mg/dL   Total Protein 8.7 (H) 6.5 - 8.1 g/dL   Albumin 4.7 3.5 - 5.0 g/dL   AST 119 (H) 15 - 41 U/L   ALT 236 (H) 0 - 44 U/L   Alkaline Phosphatase 85 38 - 126 U/L   Total Bilirubin 0.6 0.3 - 1.2 mg/dL   GFR calc non Af Amer >60 >60 mL/min   GFR calc Af Amer >60 >60 mL/min   Anion gap 11 5 - 15    Comment: Performed at [facility],  [address] [place], [place] [address]  Ethanol     Status: Abnormal   Collection Time: 01/29/20  1:31 AM  Result Value Ref Range   Alcohol, Ethyl (B) 313 (HH) <10 mg/dL    Comment: CRITICAL RESULT CALLED TO, READ BACK BY AND VERIFIED WITH BUTCH WOODS AT 0241 ON 01/29/20 RWW (NOTE) Lowest detectable limit for serum alcohol is 10 mg/dL. For medical purposes  only. Performed at [facility], [address] [place], [place] [address]   Salicylate level     Status: Abnormal   Collection Time: 01/29/20  1:31 AM  Result Value Ref Range   Salicylate Lvl <9.4 (L) 7.0 - 30.0 mg/dL    Comment: Performed at [facility], [address] [place], [place] [address]  Acetaminophen level     Status: Abnormal   Collection Time: 01/29/20  1:31 AM  Result Value Ref Range   Acetaminophen (Tylenol), Serum <10 (L) 10 - 30 ug/mL    Comment: (NOTE) Therapeutic concentrations vary significantly. A range of 10-30 ug/mL  may be an effective concentration for many patients. However, some  are best treated at concentrations outside of this range. Acetaminophen concentrations >150 ug/mL at 4 hours after ingestion  and >50 ug/mL at 12 hours after ingestion are often associated with  toxic reactions. Performed at [facility], [address] [place], [place] [address]   cbc     Status: None   Collection Time: 01/29/20  1:31 AM  Result Value Ref Range   WBC 8.4 4.0 - 10.5 K/uL   RBC 5.42 4.22 - 5.81 MIL/uL   Hemoglobin 16.6 13.0 - 17.0 g/dL   HCT 47.2 39.0 - 52.0 %   MCV 87.1 80.0 - 100.0 fL   MCH 30.6 26.0 - 34.0 pg   MCHC 35.2 30.0 - 36.0 g/dL   RDW 13.1 11.5 - 15.5 %   Platelets 287 150 - 400 K/uL   nRBC 0.0 0.0 - 0.2 %    Comment: Performed at [facility], [address], [place], [place] [address]  Urine Drug Screen, Qualitative     Status: None   Collection Time: 01/29/20  1:31 AM  Result Value Ref Range   Tricyclic, Ur Screen NONE DETECTED NONE  DETECTED   Amphetamines, Ur Screen NONE DETECTED NONE DETECTED   MDMA (Ecstasy)Ur Screen NONE DETECTED NONE DETECTED   Cocaine Metabolite,Ur [place] NONE DETECTED NONE DETECTED   Opiate, Ur Screen NONE DETECTED NONE DETECTED   Phencyclidine (PCP) Ur S NONE DETECTED NONE DETECTED   Cannabinoid 50 Ng, Ur [place] NONE DETECTED NONE DETECTED   Barbiturates, Ur Screen NONE DETECTED NONE DETECTED   Benzodiazepine, Ur Scrn NONE DETECTED NONE DETECTED   Methadone Scn, Ur NONE DETECTED NONE DETECTED    Comment: (NOTE) Tricyclics + metabolites, urine    Cutoff 1000 ng/mL Amphetamines + metabolites, urine  Cutoff 1000 ng/mL MDMA (Ecstasy), urine              Cutoff 500 ng/mL Cocaine Metabolite, urine          Cutoff 300 ng/mL Opiate + metabolites, urine        Cutoff 300 ng/mL Phencyclidine (PCP), urine         Cutoff 25 ng/mL Cannabinoid, urine                 Cutoff 50 ng/mL Barbiturates + metabolites, urine  Cutoff 200 ng/mL Benzodiazepine, urine              Cutoff 200 ng/mL Methadone, urine                   Cutoff 300 ng/mL The urine drug screen provides only a preliminary, unconfirmed analytical test result and should not be used for non-medical purposes. Clinical consideration and professional judgment should be applied to any positive drug screen result due to possible interfering substances. A more specific  alternate chemical method must be used in order to obtain a confirmed analytical result. Gas chromatography / mass spectrometry (GC/MS) is the preferred confirmat ory method. Performed at [facility], [address] [place], [place] [address]     No current facility-administered medications for this encounter.   Current Outpatient Medications  Medication Sig Dispense Refill  . blood glucose meter kit and supplies KIT Dispense based on patient and insurance preference. Use up to four times daily as directed. (FOR ICD-9 250.00, 250.01). 1 each 0  . insulin glargine (LANTUS)  100 UNIT/ML injection Inject 0.2 mLs (20 Units total) into the skin daily. 10 mL 11  . metFORMIN (GLUCOPHAGE) 500 MG tablet Take 1 tablet (500 mg total) by mouth 2 (two) times daily with a meal. 60 tablet 3    Musculoskeletal: Strength & Muscle Tone: within normal limits Gait & Station: normal Patient leans: N/A  Psychiatric Specialty Exam: Physical Exam  Nursing note and vitals reviewed. Constitutional: He is oriented to person, place, and time. He appears well-developed and well-nourished.  Cardiovascular: Normal rate.  Respiratory: Effort normal.  Musculoskeletal:        General: Normal range of motion.     Cervical back: Normal range of motion and neck supple.  Neurological: He is alert and oriented to person, place, and time.    Review of Systems  Psychiatric/Behavioral: Positive for agitation. The patient is nervous/anxious.   All other systems reviewed and are negative.   Blood pressure 123/90, pulse 79, temperature 98 F (36.7 C), temperature source Oral, resp. rate 18, height 5' 6" (1.676 m), weight 99.8 kg, SpO2 97 %.Body mass index is 35.51 kg/m.  General Appearance: Casual  Eye Contact:  Good  Speech:  Clear and Coherent  Volume:  Normal  Mood:  Anxious, Depressed and Irritable  Affect:  Congruent and Depressed  Thought Process:  Coherent  Orientation:  Full (Time, Place, and Person)  Thought Content:  Logical  Suicidal Thoughts:  No  Homicidal Thoughts:  No  Memory:  Immediate;   Good Recent;   Good Remote;   Good  Judgement:  Fair  Insight:  Lacking  Psychomotor Activity:  Normal  Concentration:  Concentration: Fair and Attention Span: Fair  Recall:  [employer] of Knowledge:  Fair  Language:  Good  Akathisia:  Negative  Handed:  Right  AIMS (if indicated):     Assets:  Communication Skills Desire for Improvement Financial Resources/Insurance Physical Health Resilience Social Support  ADL's:  Intact  Cognition:  WNL  Sleep:    Fine      Treatment Plan Summary: Daily contact with patient to assess and evaluate symptoms and progress in treatment and Plan Patient will remain under observation overnight and reassess in the a.m. to determine if he meets criteria for psychiatric inpatient admission or he could be discharged back home.  Disposition: No evidence of imminent risk to self or others at present.   Supportive therapy provided about ongoing stressors. The patient will remain under observation overnight and reassess in the a.m. to determine if he meets criteria for psychiatric inpatient admission or he could be discharged back home.  Caroline Sauger, NP 01/29/2020 3:07 AM

## 2020-04-03 ENCOUNTER — Telehealth: Payer: Self-pay | Admitting: Pharmacy Technician

## 2020-04-03 NOTE — Telephone Encounter (Signed)
Patient failed to provide 2020 Federal Tax Return.  No additional medication assistance will be provided without patient bringing requested financial documentation to [facility].  Sherilyn Dacosta Care Manager [facility]

## 2020-10-21 IMAGING — CR DG CHEST 2V
1 series · 2 of 2 positions shown · non-contrast
Comparison: None.

CLINICAL DATA: Shortness of breath and central chest pain

EXAM:
CHEST - 2 VIEW

[Series 1: dg chest 2 view · 0.14mm/px · 2 of 2 slices shown]
[im 1/2]
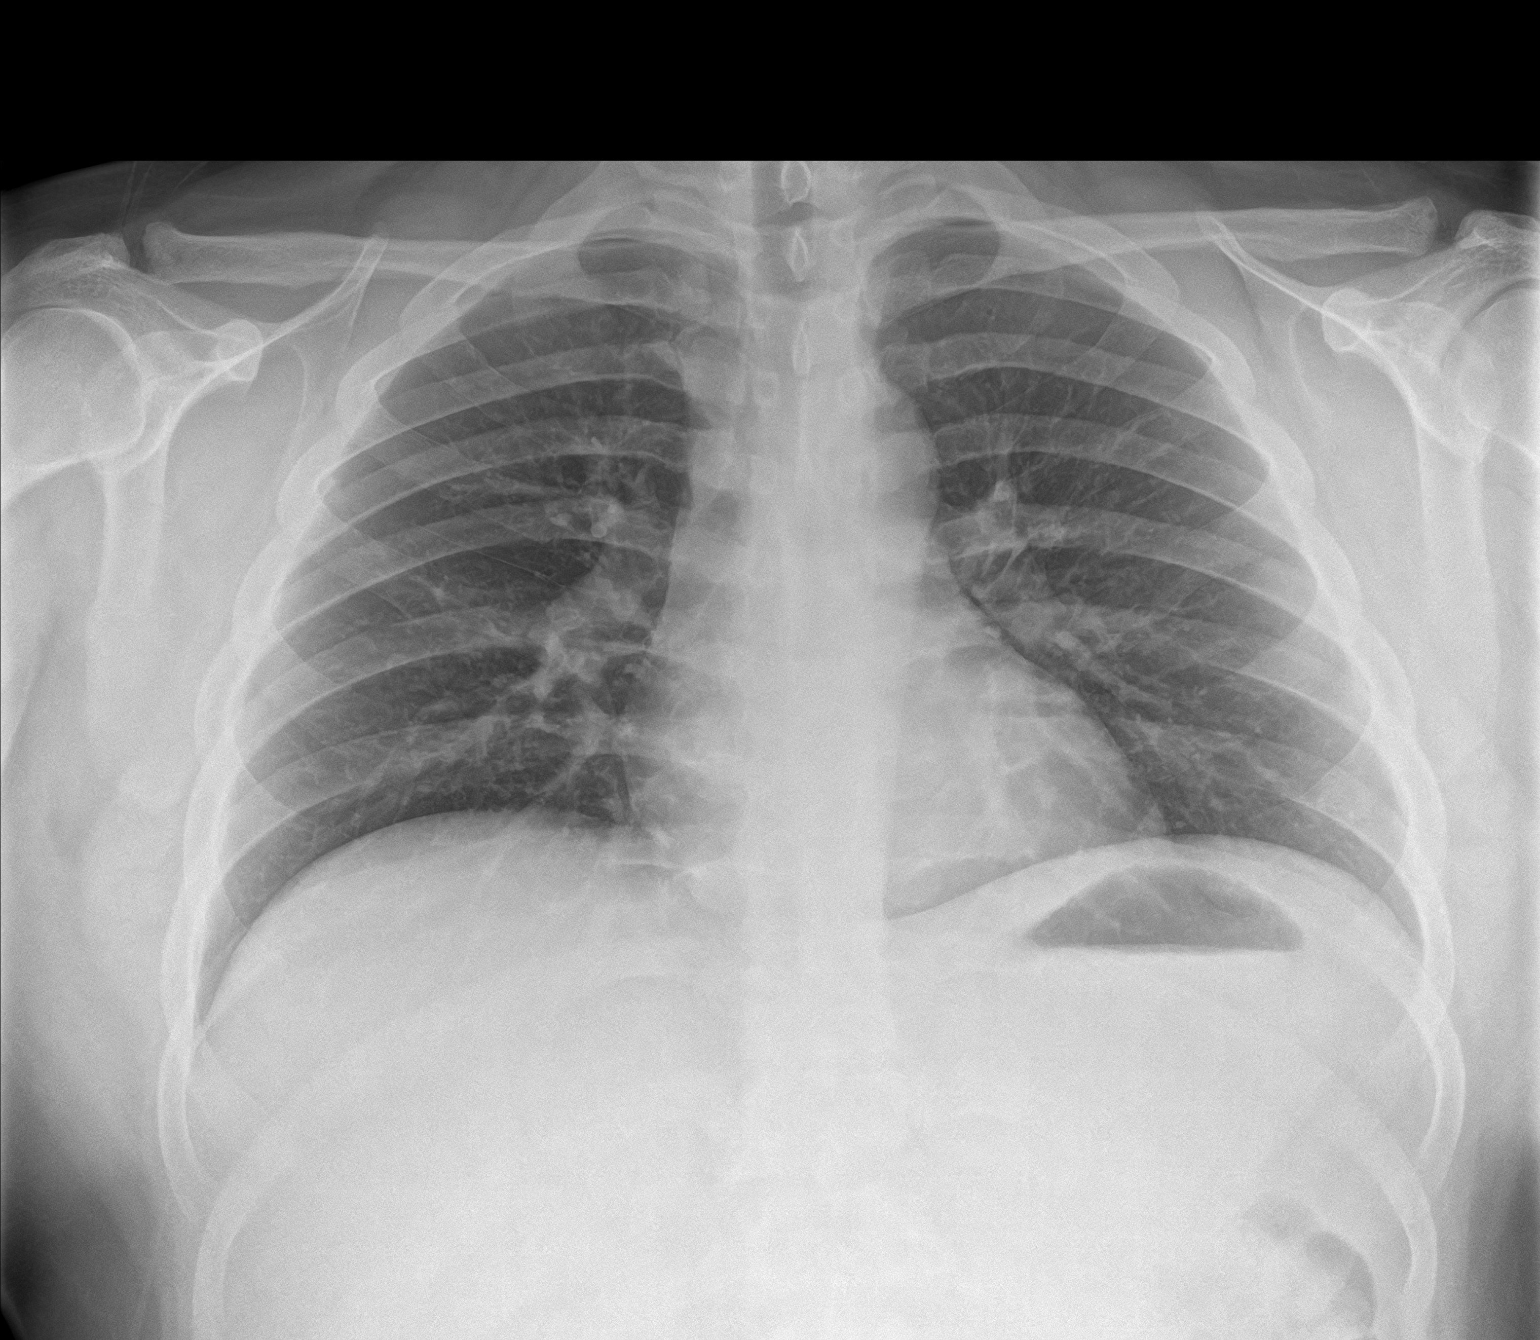
[im 2/2]
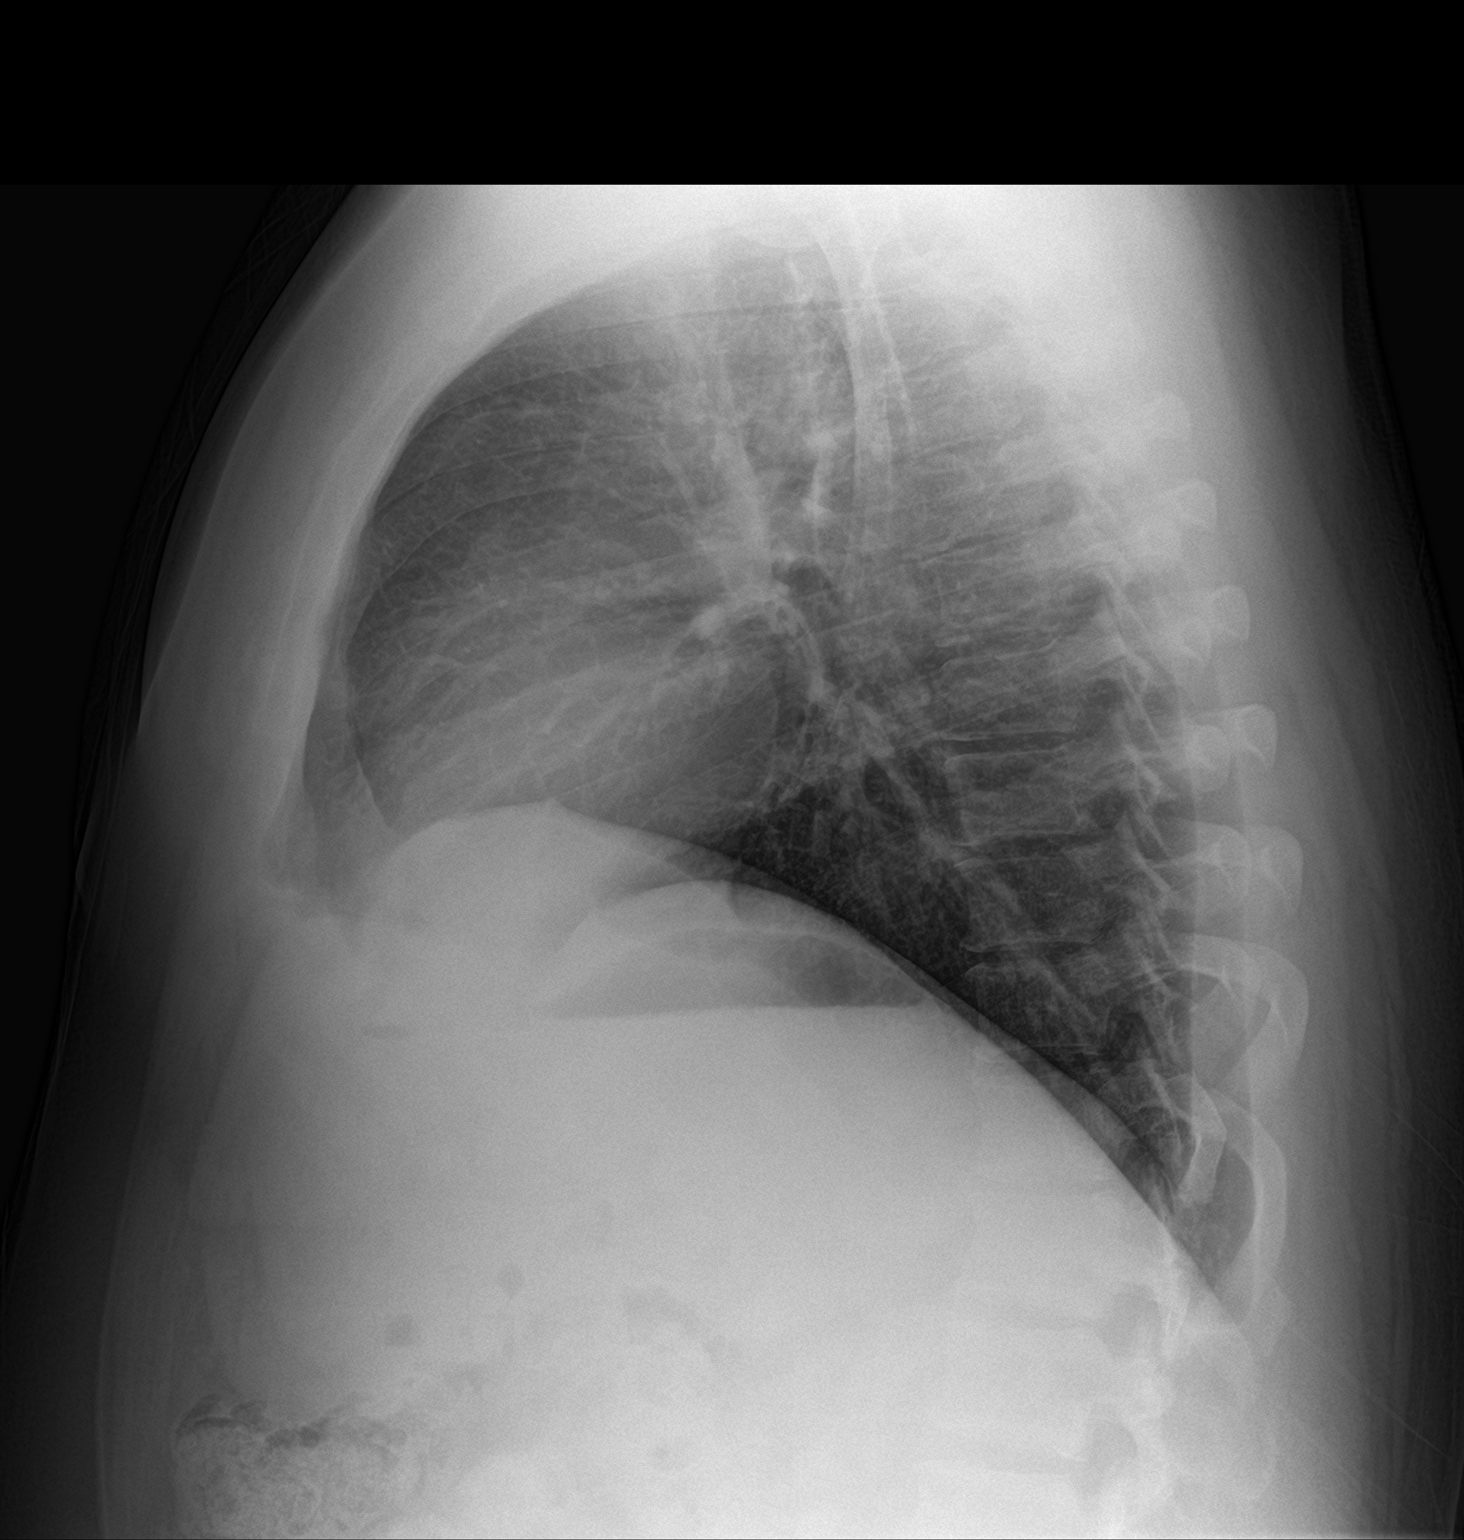

[2 of 2 positions shown; findings below may reference images not displayed]

FINDINGS: The heart size and mediastinal contours are within normal limits.
Both lungs are clear. The visualized skeletal structures are
unremarkable.
IMPRESSION: No active cardiopulmonary disease.

## 2020-11-19 ENCOUNTER — Emergency Department: Payer: Self-pay

## 2020-11-19 ENCOUNTER — Encounter: Payer: Self-pay | Admitting: Emergency Medicine

## 2020-11-19 ENCOUNTER — Other Ambulatory Visit: Payer: Self-pay

## 2020-11-19 ENCOUNTER — Emergency Department
Admission: EM | Admit: 2020-11-19 | Discharge: 2020-11-19 | Disposition: A | Discharge status: Home / Self Care | Payer: Self-pay | Attending: Emergency Medicine | Admitting: Emergency Medicine

## 2020-11-19 DIAGNOSIS — M79671 Pain in right foot: Secondary | ICD-10-CM | POA: Insufficient documentation

## 2020-11-19 DIAGNOSIS — M79673 Pain in unspecified foot: Secondary | ICD-10-CM

## 2020-11-19 DIAGNOSIS — Y92512 Supermarket, store or market as the place of occurrence of the external cause: Secondary | ICD-10-CM | POA: Insufficient documentation

## 2020-11-19 DIAGNOSIS — E1110 Type 2 diabetes mellitus with ketoacidosis without coma: Secondary | ICD-10-CM | POA: Insufficient documentation

## 2020-11-19 DIAGNOSIS — Z7984 Long term (current) use of oral hypoglycemic drugs: Secondary | ICD-10-CM | POA: Insufficient documentation

## 2020-11-19 DIAGNOSIS — W228XXA Striking against or struck by other objects, initial encounter: Secondary | ICD-10-CM | POA: Insufficient documentation

## 2020-11-19 DIAGNOSIS — Z794 Long term (current) use of insulin: Secondary | ICD-10-CM | POA: Insufficient documentation

## 2020-11-19 DIAGNOSIS — F17210 Nicotine dependence, cigarettes, uncomplicated: Secondary | ICD-10-CM | POA: Insufficient documentation

## 2020-11-19 MED ORDER — IBUPROFEN 600 MG PO TABS: 600.0000 mg | ORAL_TABLET | Freq: Three times a day (TID) | ORAL | 0 refills | Status: DC | PRN

## 2020-11-19 NOTE — ED Triage Notes (Signed)
Pt comes into the ED via POV c/o right foot pain.  Pt denies any injury to the foot.  Pt ambulatory to triage at this time and in NAD.

## 2020-11-19 NOTE — Discharge Instructions (Addendum)
You were seen today for right foot pain.  Your x-ray was negative for any acute fracture.  We have placed you in a postop shoe.  We recommend you wear this for the next 24 to 48 hours.  We recommend rest, ice and elevation.  You have been provided with a work note.

## 2020-11-19 NOTE — ED Provider Notes (Signed)
[facility] Emergency Department Provider Note ____________________________________________  Time seen: 1620  I have reviewed the triage vital signs and the nursing notes.  HISTORY  Chief Complaint  Foot Pain   HPI Willie Delacruz is a 30 y.o. male presents to the ER today with complaint of right lateral foot pain.  He reports this started yesterday after stepping up on a shelf at the grocery store.  He reports his right foot slipped off the shelf but he did not fall.  He describes the pain as sharp.  The pain only occurs with ambulation.  He has noticed some swelling of the right lateral foot but has not noticed any bruising or abrasion.  He denies numbness, tingling or weakness.  He has tried ibuprofen OTC with some improvement in symptoms.  Past Medical History:  Diagnosis Date  . Acid reflux     Patient Active Problem List   Diagnosis Date Noted  . Alcohol-induced psychosis (H[facility]) 01/29/2020  . 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 DKA (diabetic ketoacidoses) 08/13/2019    Past Surgical History:  Procedure Laterality Date  . FINGER SURGERY     per pt    Prior to Admission medications   Medication Sig Start Date End Date Taking? Authorizing Provider  ibuprofen (ADVIL) 600 MG tablet Take 1 tablet (600 mg total) by mouth every 8 (eight) hours as needed. 11/19/20  Yes Gregg Holster, Salvadore Oxford, NP  insulin glargine (LANTUS) 100 UNIT/ML injection Inject 0.2 mLs (20 Units total) into the skin daily. 08/15/19   Enedina Finner, MD  metFORMIN (GLUCOPHAGE) 500 MG tablet Take 1 tablet (500 mg total) by mouth 2 (two) times daily with a meal. 08/14/19   Enedina Finner, MD    Allergies Patient has no known allergies.  History reviewed. No pertinent family history.  Social History Social History   Tobacco Use  . Smoking status: Current Some Day Smoker    Packs/day: 0.50    Types: Cigarettes  . Smokeless tobacco: Never Used  Vaping Use  . Vaping Use: Never  used  Substance Use Topics  . Alcohol use: Yes    Alcohol/week: 2.0 standard drinks    Types: 2 Cans of beer per week  . Drug use: No    Review of Systems  Constitutional: Negative for fever, chills or body aches. Cardiovascular: Negative for chest pain or chest tightness. Respiratory: Negative for cough or shortness of breath. Musculoskeletal: Positive for right foot pain and swelling.  Negative for hip, knee or ankle pain. Skin: Negative for redness, bruising or abrasion. Neurological: Negative for focal weakness, tingling or numbness. ____________________________________________  PHYSICAL EXAM:  VITAL SIGNS: ED Triage Vitals [11/19/20 1554]  Enc Vitals Group     BP 119/81     Pulse Rate 67     Resp 18     Temp 98.2 F (36.8 C)     Temp Source Oral     SpO2 98 %     Weight 220 lb (99.8 kg)     Height 5\' 6"  (1.676 m)     Head Circumference      Peak Flow      Pain Score 7     Pain Loc      Pain Edu?      Excl. in GC?     Constitutional: Alert and oriented. Well appearing and in no distress. Cardiovascular: Normal rate, regular rhythm.  Pedal pulses 2+ on the right Respiratory: Normal respiratory effort. No wheezes/rales/rhonchi. Musculoskeletal: Normal  flexion, extension and rotation of the right ankle.  Pain with palpation over the fifth lateral metatarsal.  Associated swelling noted over the fifth lateral metatarsal.  Gait slow and steady without limp or device. Neurologic:  Normal speech and language. No gross focal neurologic deficits are appreciated. Skin:  Skin is warm, dry and intact. No rash noted.  RADIOLOGY  Imaging Orders     DG Foot Complete Right  ____________________________________________   INITIAL IMPRESSION / ASSESSMENT AND PLAN / ED COURSE  Acute Right Foot Pain:  Xray right foot negative Will place in post op shoe for comfort RX for Ibuprofen 600 mg TID prn- consume with food Encouraged rest, ice and elevation Work note  provided ____________________________________________  FINAL CLINICAL IMPRESSION(S) / ED DIAGNOSES  Final diagnoses:  right foot pain      Lorre Munroe, NP 11/19/20 1706    Jene Every, MD 11/22/20 [phone]

## 2020-11-19 NOTE — ED Notes (Signed)
Pt alert and oriented X 4, stable for discharge. RR even and unlabored, color WNL. Discussed discharge instructions and follow-up as directed. Discharge medications discussed if prescribed. Pt had opportunity to ask questions, and RN to provide patient/family eduction.  

## 2020-12-23 NOTE — Telephone Encounter (Signed)
To close telephone encounter 

## 2021-06-14 ENCOUNTER — Emergency Department: Payer: Managed Care, Other (non HMO)

## 2021-06-14 ENCOUNTER — Other Ambulatory Visit: Payer: Self-pay

## 2021-06-14 ENCOUNTER — Emergency Department
Admission: EM | Admit: 2021-06-14 | Discharge: 2021-06-14 | Disposition: A | Discharge status: Home / Self Care | Payer: Managed Care, Other (non HMO) | Attending: Emergency Medicine | Admitting: Emergency Medicine

## 2021-06-14 DIAGNOSIS — Z7984 Long term (current) use of oral hypoglycemic drugs: Secondary | ICD-10-CM | POA: Insufficient documentation

## 2021-06-14 DIAGNOSIS — R748 Abnormal levels of other serum enzymes: Secondary | ICD-10-CM

## 2021-06-14 DIAGNOSIS — E1165 Type 2 diabetes mellitus with hyperglycemia: Secondary | ICD-10-CM | POA: Insufficient documentation

## 2021-06-14 DIAGNOSIS — Z20822 Contact with and (suspected) exposure to covid-19: Secondary | ICD-10-CM | POA: Insufficient documentation

## 2021-06-14 DIAGNOSIS — F17210 Nicotine dependence, cigarettes, uncomplicated: Secondary | ICD-10-CM | POA: Diagnosis not present

## 2021-06-14 DIAGNOSIS — R002 Palpitations: Secondary | ICD-10-CM | POA: Insufficient documentation

## 2021-06-14 DIAGNOSIS — R112 Nausea with vomiting, unspecified: Secondary | ICD-10-CM | POA: Insufficient documentation

## 2021-06-14 DIAGNOSIS — R7401 Elevation of levels of liver transaminase levels: Secondary | ICD-10-CM | POA: Diagnosis not present

## 2021-06-14 DIAGNOSIS — R519 Headache, unspecified: Secondary | ICD-10-CM | POA: Insufficient documentation

## 2021-06-14 DIAGNOSIS — Z794 Long term (current) use of insulin: Secondary | ICD-10-CM | POA: Insufficient documentation

## 2021-06-14 DIAGNOSIS — R1010 Upper abdominal pain, unspecified: Secondary | ICD-10-CM | POA: Insufficient documentation

## 2021-06-14 DIAGNOSIS — R739 Hyperglycemia, unspecified: Secondary | ICD-10-CM

## 2021-06-14 LAB — RESP PANEL BY RT-PCR (FLU A&B, COVID) ARPGX2
Influenza A by PCR: NEGATIVE
Influenza B by PCR: NEGATIVE
SARS Coronavirus 2 by RT PCR: NEGATIVE

## 2021-06-14 LAB — COMPREHENSIVE METABOLIC PANEL
ALT: 113 U/L — ABNORMAL HIGH (ref 0–44)
AST: 39 U/L (ref 15–41)
Albumin: 4.1 g/dL (ref 3.5–5.0)
Alkaline Phosphatase: 161 U/L — ABNORMAL HIGH (ref 38–126)
Anion gap: 14 (ref 5–15)
BUN: 12 mg/dL (ref 6–20)
CO2: 19 mmol/L — ABNORMAL LOW (ref 22–32)
Calcium: 8.8 mg/dL — ABNORMAL LOW (ref 8.9–10.3)
Chloride: 101 mmol/L (ref 98–111)
Creatinine, Ser: 0.8 mg/dL (ref 0.61–1.24)
GFR, Estimated: >60 mL/min (ref >60)
Glucose, Bld: 396 mg/dL — ABNORMAL HIGH (ref 70–99)
Potassium: 3.8 mmol/L (ref 3.5–5.1)
Sodium: 134 mmol/L — ABNORMAL LOW (ref 135–145)
Total Bilirubin: 2 mg/dL — ABNORMAL HIGH (ref 0.3–1.2)
Total Protein: 7.9 g/dL (ref 6.5–8.1)

## 2021-06-14 LAB — CBC
HCT: 42.3 % (ref 39.0–52.0)
Hemoglobin: 15.3 g/dL (ref 13.0–17.0)
MCH: 29.9 pg (ref 26.0–34.0)
MCHC: 36.2 g/dL — ABNORMAL HIGH (ref 30.0–36.0)
MCV: 82.8 fL (ref 80.0–100.0)
Platelets: 240 10*3/uL (ref 150–400)
RBC: 5.11 MIL/uL (ref 4.22–5.81)
RDW: 12.7 % (ref 11.5–15.5)
WBC: 5.7 10*3/uL (ref 4.0–10.5)
nRBC: 0 % (ref 0.0–0.2)

## 2021-06-14 LAB — LIPASE, BLOOD: Lipase: 31 U/L (ref 11–51)

## 2021-06-14 LAB — TROPONIN I (HIGH SENSITIVITY): Troponin I (High Sensitivity): 6 ng/L (ref <18)

## 2021-06-14 MED ORDER — ONDANSETRON 4 MG PO TBDP
4.0000 mg | ORAL_TABLET | Freq: Three times a day (TID) | ORAL | 0 refills | Status: DC | PRN
Start: 2021-06-14 — End: 2023-01-23

## 2021-06-14 MED ORDER — DROPERIDOL 2.5 MG/ML IJ SOLN
2.5000 mg | Freq: Once | INTRAMUSCULAR | Status: AC
  Administered 2021-06-14: 2.5 mg via INTRAVENOUS
  Filled 2021-06-14: qty 2

## 2021-06-14 MED ORDER — DIPHENHYDRAMINE HCL 50 MG/ML IJ SOLN
12.5000 mg | INTRAMUSCULAR | Status: AC
  Administered 2021-06-14: 12.5 mg via INTRAVENOUS
  Filled 2021-06-14: qty 1

## 2021-06-14 MED ORDER — KETOROLAC TROMETHAMINE 30 MG/ML IJ SOLN
15.0000 mg | Freq: Once | INTRAMUSCULAR | Status: AC
  Administered 2021-06-14: 15 mg via INTRAVENOUS
  Filled 2021-06-14: qty 1

## 2021-06-14 MED ORDER — LACTATED RINGERS IV BOLUS
1000.0000 mL | Freq: Once | INTRAVENOUS | Status: AC
  Administered 2021-06-14: 1000 mL via INTRAVENOUS

## 2021-06-14 NOTE — ED Notes (Signed)
Verbalized understanding discharge instructions, prescriptions, and follow-up. In no acute distress.   

## 2021-06-14 NOTE — ED Notes (Signed)
Pt was given milk to drink. Pt tolerated well.

## 2021-06-14 NOTE — Discharge Instructions (Addendum)
Continue taking all of your diabetes medications to control your blood sugar.  Follow-up with your doctor for further evaluation of your symptoms

## 2021-06-14 NOTE — ED Provider Notes (Signed)
Procedures  Clinical Course as of 06/14/21 1040  Sun Jun 14, 2021  7680 US ABDOMEN LIMITED RUQ (LIVER/GB) Ultrasound is reassuring with some steatosis but no acute abnormality.  This furthers my belief that his mild transaminitis is likely due to his episodes of recent vomiting.  Add on lipase has been ordered.  Transferring ED care to Dr. Scotty Court to follow-up and reassess after treatment.  Also to check the results of the COVID test. [CF]    Clinical Course User Index [CF] Loleta Rose, MD    ----------------------------------------- 10:40 AM on 06/14/2021 -----------------------------------------   Pt feeling much better, tolerating PO.  Workup reassuring. VS normal.   Sharman Cheek, MD 06/14/21 1041

## 2021-06-14 NOTE — ED Triage Notes (Signed)
Pt states has a headache, hypertension, dry mouth, sensation of "heart beating out of my chest" and "heaving" since Wednesday. Pt denies known fever and appears in no acute distress.

## 2021-06-14 NOTE — ED Provider Notes (Signed)
[facility] Emergency Department Provider Note  ____________________________________________   Event Date/Time   First MD Initiated Contact with Patient 06/14/21 0559     (approximate)  I have reviewed the triage vital signs and the nursing notes.   HISTORY  Chief Complaint Hypertension    HPI Willie Delacruz is a 30 y.o. male who reports a history of diabetes but states he has been taken off all of his medication due to dietary changes.  He presents for evaluation of about 4 days of persistent headache, nausea, vomiting, hyperglycemia, and stating that "water tasted funny".  The patient reports that he used to be on insulin and oral diabetes medicine.  He has been working on his diet and exercise and states that his doctor took him off all of the medication.  However recently his blood sugar has been elevated.  (It is important to note that initially he reported that his blood pressure was high, but after I talked with him about it, he thought that having high blood glucose meant that his blood pressure was high, and he has never been diagnosed with hypertension nor has he been taking blood pressure medicine.)  He states that for about 4 days he has had nausea and vomiting and throws up when he tries to eat anything.  He has had a little bit of aching pain in his upper abdomen but not currently, and it might be related to the vomiting.  He has had the feeling of palpitations but not chest pain.  He has had a generalized aching headache that is severe at times.  No visual changes.  No sore throat, fever, chest pain, lower abdominal pain, nor dysuria.  He says that he has been drinking a lot of water to bring his blood sugar down but he had to stop because "the water tastes funny".  He said his food taste normal and he has been able to eat but not as much as usual because of the vomiting.       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 Authorizing Provider  ibuprofen (ADVIL) 600 MG tablet Take 1 tablet (600 mg total) by mouth every 8 (eight) hours as needed. 11/19/20   Jearld Fenton, NP  insulin glargine (LANTUS) 100 UNIT/ML injection Inject 0.2 mLs (20 Units total) into the skin daily. 08/15/19   Fritzi Mandes, MD  metFORMIN (GLUCOPHAGE) 500 MG tablet Take 1 tablet (500 mg total) by mouth 2 (two) times daily with a meal. 08/14/19   Fritzi Mandes, MD    Allergies Patient has no known allergies.  No family history on file.  Social History Social History   Tobacco Use   Smoking status: Some Days    Packs/day: 0.50    Types: Cigarettes   Smokeless tobacco: Never  Vaping Use   Vaping Use: Never used  Substance Use Topics   Alcohol use: Yes    Alcohol/week: 2.0 standard drinks    Types: 2 Cans of beer per week   Drug use: No    Review of Systems Constitutional:  No fever/chills Eyes: No visual changes. ENT: No sore throat, but reports a funny taste when he drinks water but not when he eats food. Cardiovascular: Positive for intermittent palpitations.  Denies chest pain. Respiratory: Denies shortness of breath. Gastrointestinal: Persistent nausea and vomiting for days, mild occasional epigastric pain. Genitourinary: Negative for dysuria. Musculoskeletal: Negative for neck pain.  Negative for back pain. Integumentary: Negative for rash. Neurological: Generalized headache, no focal numbness nor weakness.   ____________________________________________   PHYSICAL EXAM:  VITAL SIGNS: ED Triage Vitals  Enc Vitals Group     BP 06/14/21 0531 (!) 137/103     Pulse Rate  06/14/21 0531 82     Resp 06/14/21 0531 16     Temp 06/14/21 0531 98.7 F (37.1 C)     Temp Source 06/14/21 0531 Oral     SpO2 06/14/21 0531 95 %     Weight 06/14/21 0532 104.3 kg (230 lb)     Height 06/14/21 0532 1.676 m ('5\' 6"' )     Head Circumference --      Peak Flow --      Pain Score 06/14/21 0532 7     Pain Loc --      Pain Edu? --      Excl. in [place]? --     Constitutional: Alert and oriented.  Eyes: Conjunctivae are normal.  Head: Atraumatic. Nose: No congestion/rhinnorhea. Mouth/Throat: Patient is wearing a mask. Neck: No stridor.  No meningeal signs.   Cardiovascular: Normal rate, regular rhythm. Good peripheral circulation. Respiratory: Normal respiratory effort.  No retractions. Gastrointestinal: Soft and nontender. No distention.  Musculoskeletal: No lower extremity tenderness nor edema. No gross deformities of extremities. Neurologic:  Normal speech and language. No gross focal neurologic deficits are appreciated.  Skin:  Skin is warm, dry and intact. Psychiatric: Mood and affect are normal. Speech and behavior are normal.  ____________________________________________   LABS (all labs ordered are listed, but only abnormal results are displayed)  Labs Reviewed  CBC - Abnormal; Notable for the following components:      Result Value   MCHC 36.2 (*)    All other components within normal limits  COMPREHENSIVE METABOLIC PANEL - Abnormal; Notable for the following components:   Sodium 134 (*)    CO2 19 (*)    Glucose, Bld 396 (*)    Calcium 8.8 (*)    ALT 113 (*)    Alkaline Phosphatase 161 (*)    Total Bilirubin 2.0 (*)    All other components within normal limits  RESP PANEL BY RT-PCR (FLU A&B, COVID) ARPGX2  LIPASE, BLOOD  TROPONIN I (HIGH SENSITIVITY)   ____________________________________________  EKG  ED ECG REPORT I, Hinda Kehr, the attending physician, personally viewed and interpreted this ECG.  Date: 06/14/2021 EKG Time: 5:37 AM Rate:  87 Rhythm: normal sinus rhythm QRS Axis: normal Intervals: normal ST/T Wave abnormalities: Inverted T waves in lead III, otherwise unremarkable Narrative Interpretation: no evidence of acute ischemia  ____________________________________________  RADIOLOGY I, Hinda Kehr, personally viewed and evaluated these images (plain radiographs) as part of my medical decision making, as well as reviewing the written report by the radiologist.  ED MD interpretation: Probable steatosis, no acute biliary abnormality nor obvious reason for his elevated LFTs.  Official radiology report(s): US ABDOMEN LIMITED RUQ (LIVER/GB)  Result Date: 06/14/2021 CLINICAL DATA: Elevated liver function tests.  Nausea and vomiting. EXAM: ULTRASOUND ABDOMEN LIMITED RIGHT UPPER QUADRANT COMPARISON:  None. FINDINGS: Gallbladder: No gallstones or wall thickening visualized. No  sonographic Percell Miller sign noted by sonographer. Common bile duct: Diameter: Normal, 4 mm. Liver: Moderately increased hepatic echogenicity. Portal vein is patent on color Doppler imaging with normal direction of blood flow towards the liver. Other: None. IMPRESSION: Increased hepatic echogenicity, most consistent with steatosis. No other explanation for elevated liver function tests. Electronically Signed   By: Abigail Miyamoto M.D.   On: 06/14/2021 07:01    ____________________________________________   PROCEDURES   Procedure(s) performed (including Critical Care):  Procedures   ____________________________________________   INITIAL IMPRESSION / MDM / [place] / ED COURSE  As part of my medical decision making, I reviewed the following data within the [facility] notes reviewed and incorporated, Labs reviewed , EKG interpreted , Old chart reviewed, Patient signed out to Dr. Joni Fears, and reviewed Notes from prior ED visits   Differential diagnosis includes, but is not limited to, COVID-19, other nonspecific  viral infection, gastritis, migraine.  No indication for head CT at this point.  Vital signs are stable and within normal limits.  Comprehensive metabolic panel is notable for hyperglycemia and mild LFT elevation including ALT, alk phos, and total bilirubin.  This could be due to his vomiting and decreased oral intake or could be indicative of a liver or biliary issue, so I will proceed with a right upper quadrant ultrasound.  High-sensitivity troponin is normal and given the lack of chest pain I do not feel we need to get a second 1.  Respiratory viral panel is pending.  CBC is within normal limits.  Treatment plan is 1 L lactated Ringer's, droperidol 2.5 mg IV, Toradol 15 mg IV, Benadryl 12.5 mg IV.  Patient agrees with the plan.      Clinical Course as of 06/14/21 0745  Sun Jun 14, 2021  0716 US ABDOMEN LIMITED RUQ (LIVER/GB) Ultrasound is reassuring with some steatosis but no acute abnormality.  This furthers my belief that his mild transaminitis is likely due to his episodes of recent vomiting.  Add on lipase has been ordered.  Transferring ED care to Dr. Joni Fears to follow-up and reassess after treatment.  Also to check the results of the COVID test. [CF]    Clinical Course User Index [CF] Hinda Kehr, MD     ____________________________________________  FINAL CLINICAL IMPRESSION(S) / ED DIAGNOSES  Final diagnoses:  Acute nonintractable headache, unspecified headache type  Nausea and vomiting, intractability of vomiting not specified, unspecified vomiting type  Elevated liver enzymes  Hyperglycemia     MEDICATIONS GIVEN DURING THIS VISIT:  Medications  lactated ringers bolus 1,000 mL (1,000 mLs Intravenous New Bag/Given 06/14/21 0642)  droperidol (INAPSINE) 2.5 MG/ML injection 2.5 mg (2.5 mg Intravenous Given 06/14/21 0634)  diphenhydrAMINE (BENADRYL) injection 12.5 mg (12.5 mg Intravenous Given 06/14/21 0634)  ketorolac (TORADOL) 30 MG/ML injection 15 mg (15 mg Intravenous  Given 06/14/21 1610)     ED Discharge Orders     None        Note:  This document was prepared using Dragon voice recognition software and may include unintentional dictation errors.   Hinda Kehr, MD 06/14/21 [phone]

## 2023-01-23 ENCOUNTER — Emergency Department: Payer: Medicaid Other

## 2023-01-23 ENCOUNTER — Inpatient Hospital Stay
Admission: EM | Admit: 2023-01-23 | Discharge: 2023-02-02 | DRG: 308 | Disposition: E | Payer: Medicaid Other | Attending: Pulmonary Disease | Admitting: Pulmonary Disease

## 2023-01-23 ENCOUNTER — Inpatient Hospital Stay: Payer: Medicaid Other

## 2023-01-23 DIAGNOSIS — E872 Acidosis, unspecified: Secondary | ICD-10-CM | POA: Diagnosis present

## 2023-01-23 DIAGNOSIS — M96A3 Multiple fractures of ribs associated with chest compression and cardiopulmonary resuscitation: Secondary | ICD-10-CM | POA: Diagnosis present

## 2023-01-23 DIAGNOSIS — E876 Hypokalemia: Secondary | ICD-10-CM | POA: Diagnosis present

## 2023-01-23 DIAGNOSIS — Y92008 Other place in unspecified non-institutional (private) residence as the place of occurrence of the external cause: Secondary | ICD-10-CM

## 2023-01-23 DIAGNOSIS — I4901 Ventricular fibrillation: Principal | ICD-10-CM | POA: Diagnosis present

## 2023-01-23 DIAGNOSIS — Z66 Do not resuscitate: Secondary | ICD-10-CM | POA: Diagnosis present

## 2023-01-23 DIAGNOSIS — Z515 Encounter for palliative care: Secondary | ICD-10-CM | POA: Diagnosis not present

## 2023-01-23 DIAGNOSIS — K7200 Acute and subacute hepatic failure without coma: Secondary | ICD-10-CM

## 2023-01-23 DIAGNOSIS — J9601 Acute respiratory failure with hypoxia: Secondary | ICD-10-CM | POA: Diagnosis present

## 2023-01-23 DIAGNOSIS — T71194A Asphyxiation due to mechanical threat to breathing due to other causes, undetermined, initial encounter: Principal | ICD-10-CM

## 2023-01-23 DIAGNOSIS — I7774 Dissection of vertebral artery: Secondary | ICD-10-CM | POA: Diagnosis present

## 2023-01-23 DIAGNOSIS — Z6841 Body Mass Index (BMI) 40.0 and over, adult: Secondary | ICD-10-CM

## 2023-01-23 DIAGNOSIS — Z1152 Encounter for screening for COVID-19: Secondary | ICD-10-CM | POA: Diagnosis not present

## 2023-01-23 DIAGNOSIS — R578 Other shock: Secondary | ICD-10-CM | POA: Diagnosis present

## 2023-01-23 DIAGNOSIS — Z794 Long term (current) use of insulin: Secondary | ICD-10-CM | POA: Diagnosis not present

## 2023-01-23 DIAGNOSIS — G931 Anoxic brain damage, not elsewhere classified: Secondary | ICD-10-CM | POA: Diagnosis not present

## 2023-01-23 DIAGNOSIS — K760 Fatty (change of) liver, not elsewhere classified: Secondary | ICD-10-CM | POA: Diagnosis present

## 2023-01-23 DIAGNOSIS — F17210 Nicotine dependence, cigarettes, uncomplicated: Secondary | ICD-10-CM | POA: Diagnosis present

## 2023-01-23 DIAGNOSIS — K219 Gastro-esophageal reflux disease without esophagitis: Secondary | ICD-10-CM | POA: Diagnosis present

## 2023-01-23 DIAGNOSIS — N179 Acute kidney failure, unspecified: Secondary | ICD-10-CM

## 2023-01-23 DIAGNOSIS — E1165 Type 2 diabetes mellitus with hyperglycemia: Secondary | ICD-10-CM | POA: Diagnosis present

## 2023-01-23 DIAGNOSIS — F10129 Alcohol abuse with intoxication, unspecified: Secondary | ICD-10-CM | POA: Diagnosis present

## 2023-01-23 DIAGNOSIS — I462 Cardiac arrest due to underlying cardiac condition: Secondary | ICD-10-CM | POA: Diagnosis present

## 2023-01-23 DIAGNOSIS — Z7984 Long term (current) use of oral hypoglycemic drugs: Secondary | ICD-10-CM

## 2023-01-23 DIAGNOSIS — F10929 Alcohol use, unspecified with intoxication, unspecified: Secondary | ICD-10-CM

## 2023-01-23 DIAGNOSIS — R40243 Glasgow coma scale score 3-8, unspecified time: Secondary | ICD-10-CM

## 2023-01-23 DIAGNOSIS — S2241XA Multiple fractures of ribs, right side, initial encounter for closed fracture: Secondary | ICD-10-CM

## 2023-01-23 DIAGNOSIS — Y908 Blood alcohol level of 240 mg/100 ml or more: Secondary | ICD-10-CM | POA: Diagnosis present

## 2023-01-23 DIAGNOSIS — N170 Acute kidney failure with tubular necrosis: Secondary | ICD-10-CM | POA: Diagnosis present

## 2023-01-23 DIAGNOSIS — K922 Gastrointestinal hemorrhage, unspecified: Secondary | ICD-10-CM | POA: Diagnosis present

## 2023-01-23 DIAGNOSIS — R680 Hypothermia, not associated with low environmental temperature: Secondary | ICD-10-CM | POA: Diagnosis present

## 2023-01-23 DIAGNOSIS — I469 Cardiac arrest, cause unspecified: Secondary | ICD-10-CM | POA: Diagnosis present

## 2023-01-23 DIAGNOSIS — R9431 Abnormal electrocardiogram [ECG] [EKG]: Secondary | ICD-10-CM

## 2023-01-23 LAB — URINALYSIS, ROUTINE W REFLEX MICROSCOPIC
Bacteria, UA: NONE SEEN
Bilirubin Urine: NEGATIVE
Glucose, UA: NEGATIVE mg/dL
Ketones, ur: NEGATIVE mg/dL
Nitrite: NEGATIVE
Protein, ur: NEGATIVE mg/dL
Specific Gravity, Urine: 1.002 — ABNORMAL LOW (ref 1.005–1.030)
Squamous Epithelial / HPF: NONE SEEN /HPF (ref 0–5)
pH: 6 (ref 5.0–8.0)

## 2023-01-23 LAB — CBC WITH DIFFERENTIAL/PLATELET
Abs Immature Granulocytes: 0.31 10*3/uL — ABNORMAL HIGH (ref 0.00–0.07)
Basophils Absolute: 0.1 10*3/uL (ref 0.0–0.1)
Basophils Relative: 1 %
Eosinophils Absolute: 0.1 10*3/uL (ref 0.0–0.5)
Eosinophils Relative: 1 %
HCT: 48.5 % (ref 39.0–52.0)
Hemoglobin: 14.9 g/dL (ref 13.0–17.0)
Immature Granulocytes: 4 %
Lymphocytes Relative: 71 %
Lymphs Abs: 5.6 10*3/uL — ABNORMAL HIGH (ref 0.7–4.0)
MCH: 31.3 pg (ref 26.0–34.0)
MCHC: 30.7 g/dL (ref 30.0–36.0)
MCV: 101.9 fL — ABNORMAL HIGH (ref 80.0–100.0)
Monocytes Absolute: 0.5 10*3/uL (ref 0.1–1.0)
Monocytes Relative: 6 %
Neutro Abs: 1.3 10*3/uL — ABNORMAL LOW (ref 1.7–7.7)
Neutrophils Relative %: 17 %
Platelets: 160 10*3/uL (ref 150–400)
RBC: 4.76 MIL/uL (ref 4.22–5.81)
RDW: 12.6 % (ref 11.5–15.5)
Smear Review: NORMAL
WBC: 7.8 10*3/uL (ref 4.0–10.5)
nRBC: 0.4 % — ABNORMAL HIGH (ref 0.0–0.2)

## 2023-01-23 LAB — COMPREHENSIVE METABOLIC PANEL
ALT: 1024 U/L — ABNORMAL HIGH (ref 0–44)
AST: 748 U/L — ABNORMAL HIGH (ref 15–41)
Albumin: 3.7 g/dL (ref 3.5–5.0)
Alkaline Phosphatase: 107 U/L (ref 38–126)
Anion gap: 22 — ABNORMAL HIGH (ref 5–15)
BUN: 8 mg/dL (ref 6–20)
CO2: 15 mmol/L — ABNORMAL LOW (ref 22–32)
Calcium: 8.6 mg/dL — ABNORMAL LOW (ref 8.9–10.3)
Chloride: 100 mmol/L (ref 98–111)
Creatinine, Ser: 1.36 mg/dL — ABNORMAL HIGH (ref 0.61–1.24)
GFR, Estimated: >60 mL/min (ref >60)
Glucose, Bld: 237 mg/dL — ABNORMAL HIGH (ref 70–99)
Potassium: 3.9 mmol/L (ref 3.5–5.1)
Sodium: 137 mmol/L (ref 135–145)
Total Bilirubin: 0.6 mg/dL (ref 0.3–1.2)
Total Protein: 6.8 g/dL (ref 6.5–8.1)

## 2023-01-23 LAB — BLOOD GAS, ARTERIAL
Acid-base deficit: 20.6 mmol/L — ABNORMAL HIGH (ref 0.0–2.0)
Acid-base deficit: 13.5 mmol/L — ABNORMAL HIGH (ref 0.0–2.0)
Bicarbonate: 10.8 mmol/L — ABNORMAL LOW (ref 20.0–28.0)
Bicarbonate: 13.8 mmol/L — ABNORMAL LOW (ref 20.0–28.0)
FIO2: 100 %
FIO2: 60 %
MECHVT: 500 mL
MECHVT: 500 mL
Mechanical Rate: 20
Mechanical Rate: 20
O2 Saturation: 100 %
O2 Saturation: 100 %
PEEP: 5 cmH2O
PEEP: 8 cmH2O
Patient temperature: 37
Patient temperature: 37
pCO2 arterial: 46 mmHg (ref 32–48)
pCO2 arterial: 36 mmHg (ref 32–48)
pH, Arterial: 6.98 — CL (ref 7.35–7.45)
pH, Arterial: 7.19 — CL (ref 7.35–7.45)
pO2, Arterial: 212 mmHg — ABNORMAL HIGH (ref 83–108)
pO2, Arterial: 127 mmHg — ABNORMAL HIGH (ref 83–108)

## 2023-01-23 LAB — TYPE AND SCREEN: Antibody Screen: NEGATIVE

## 2023-01-23 LAB — TROPONIN I (HIGH SENSITIVITY)
Troponin I (High Sensitivity): 89 ng/L — ABNORMAL HIGH (ref <18)
Troponin I (High Sensitivity): 699 ng/L (ref <18)

## 2023-01-23 LAB — URINE DRUG SCREEN, QUALITATIVE (ARMC ONLY)
Amphetamines, Ur Screen: NOT DETECTED
Barbiturates, Ur Screen: NOT DETECTED
Benzodiazepine, Ur Scrn: NOT DETECTED
Cannabinoid 50 Ng, Ur ~~LOC~~: NOT DETECTED
Cocaine Metabolite,Ur ~~LOC~~: NOT DETECTED
MDMA (Ecstasy)Ur Screen: NOT DETECTED
Methadone Scn, Ur: NOT DETECTED
Opiate, Ur Screen: NOT DETECTED
Phencyclidine (PCP) Ur S: NOT DETECTED

## 2023-01-23 LAB — ETHANOL: Alcohol, Ethyl (B): 364 mg/dL (ref <10)

## 2023-01-23 LAB — HIV ANTIBODY (ROUTINE TESTING W REFLEX): HIV Screen 4th Generation wRfx: NONREACTIVE

## 2023-01-23 LAB — CBG MONITORING, ED: Glucose-Capillary: 220 mg/dL — ABNORMAL HIGH (ref 70–99)

## 2023-01-23 LAB — PROTIME-INR
INR: 1.7 — ABNORMAL HIGH (ref 0.8–1.2)
Prothrombin Time: 19.9 seconds — ABNORMAL HIGH (ref 11.4–15.2)

## 2023-01-23 LAB — GLUCOSE, CAPILLARY
Glucose-Capillary: 154 mg/dL — ABNORMAL HIGH (ref 70–99)
Glucose-Capillary: 130 mg/dL — ABNORMAL HIGH (ref 70–99)

## 2023-01-23 LAB — OCCULT BLOOD GASTRIC / DUODENUM (SPECIMEN CUP)
Occult Blood, Gastric: POSITIVE — AB
pH, Gastric: 7

## 2023-01-23 LAB — MRSA NEXT GEN BY PCR, NASAL: MRSA by PCR Next Gen: NOT DETECTED

## 2023-01-23 LAB — ABO/RH: ABO/RH(D): O POS

## 2023-01-23 LAB — BRAIN NATRIURETIC PEPTIDE: B Natriuretic Peptide: 135.8 pg/mL — ABNORMAL HIGH (ref 0.0–100.0)

## 2023-01-23 LAB — BPAM RBC
Blood Product Expiration Date: 202405062359
Unit Type and Rh: 5100
Unit Type and Rh: 5100

## 2023-01-23 LAB — LACTIC ACID, PLASMA: Lactic Acid, Venous: >9 mmol/L (ref 0.5–1.9)

## 2023-01-23 LAB — SALICYLATE LEVEL: Salicylate Lvl: <7 mg/dL — ABNORMAL LOW (ref 7.0–30.0)

## 2023-01-23 LAB — ACETAMINOPHEN LEVEL: Acetaminophen (Tylenol), Serum: <10 ug/mL — ABNORMAL LOW (ref 10–30)

## 2023-01-23 LAB — APTT: aPTT: 81 seconds — ABNORMAL HIGH (ref 24–36)

## 2023-01-23 LAB — PROCALCITONIN: Procalcitonin: 0.24 ng/mL

## 2023-01-23 MED ORDER — SODIUM CHLORIDE 0.9 % IV SOLN
50.0000 ug/h | INTRAVENOUS | Status: DC
  Administered 2023-01-23: 50 ug/h via INTRAVENOUS
  Filled 2023-01-23 (×2): qty 1

## 2023-01-23 MED ORDER — PHENYLEPHRINE HCL-NACL 20-0.9 MG/250ML-% IV SOLN
0.0000 ug/min | INTRAVENOUS | Status: DC
  Administered 2023-01-23: 80 ug/min via INTRAVENOUS
  Filled 2023-01-23: qty 250

## 2023-01-23 MED ORDER — VASOPRESSIN 20 UNITS/100 ML INFUSION FOR SHOCK
0.0000 [IU]/min | INTRAVENOUS | Status: DC
  Administered 2023-01-23: 0.03 [IU]/min via INTRAVENOUS
  Filled 2023-01-23: qty 100

## 2023-01-23 MED ORDER — PANTOPRAZOLE SODIUM 40 MG IV SOLR: 40.0000 mg | Freq: Two times a day (BID) | INTRAVENOUS | Status: DC

## 2023-01-23 MED ORDER — MORPHINE 100MG IN NS 100ML (1MG/ML) PREMIX INFUSION
INTRAVENOUS | Status: AC
  Filled 2023-01-23: qty 100

## 2023-01-23 MED ORDER — LORAZEPAM 2 MG/ML IJ SOLN
0.5000 mg/h | INTRAVENOUS | Status: DC
  Filled 2023-01-23: qty 25

## 2023-01-23 MED ORDER — SODIUM BICARBONATE 8.4 % IV SOLN
INTRAVENOUS | Status: AC
  Filled 2023-01-23: qty 50

## 2023-01-23 MED ORDER — SODIUM CHLORIDE 0.9 % IV BOLUS (SEPSIS)
1000.0000 mL | Freq: Once | INTRAVENOUS | Status: AC
  Administered 2023-01-23: 1000 mL via INTRAVENOUS

## 2023-01-23 MED ORDER — MORPHINE 100MG IN NS 100ML (1MG/ML) PREMIX INFUSION
10.0000 mg/h | INTRAVENOUS | Status: DC
  Administered 2023-01-23: 10 mg/h via INTRAVENOUS

## 2023-01-23 MED ORDER — DOCUSATE SODIUM 50 MG/5ML PO LIQD: 100.0000 mg | Freq: Two times a day (BID) | ORAL | Status: DC | PRN

## 2023-01-23 MED ORDER — MORPHINE 100MG IN NS 100ML (1MG/ML) PREMIX INFUSION: 1.0000 mg/h | INTRAVENOUS | Status: DC

## 2023-01-23 MED ORDER — SODIUM CHLORIDE 0.9 % IV BOLUS: 1000.0000 mL | Freq: Once | INTRAVENOUS | Status: DC

## 2023-01-23 MED ORDER — IOHEXOL 350 MG/ML SOLN
100.0000 mL | Freq: Once | INTRAVENOUS | Status: AC | PRN
  Administered 2023-01-23: 100 mL via INTRAVENOUS

## 2023-01-23 MED ORDER — PANTOPRAZOLE 80MG IVPB - SIMPLE MED
80.0000 mg | Freq: Once | INTRAVENOUS | Status: AC
  Administered 2023-01-23: 80 mg via INTRAVENOUS
  Filled 2023-01-23: qty 100

## 2023-01-23 MED ORDER — SODIUM BICARBONATE 8.4 % IV SOLN
150.0000 meq | Freq: Once | INTRAVENOUS | Status: AC
  Administered 2023-01-23: 150 meq via INTRAVENOUS
  Filled 2023-01-23: qty 50

## 2023-01-23 MED ORDER — SODIUM BICARBONATE 8.4 % IV SOLN
100.0000 meq | Freq: Once | INTRAVENOUS | Status: AC
  Administered 2023-01-23: 100 meq via INTRAVENOUS
  Filled 2023-01-23: qty 50

## 2023-01-23 MED ORDER — NOREPINEPHRINE 4 MG/250ML-% IV SOLN
0.0000 ug/min | INTRAVENOUS | Status: DC
  Administered 2023-01-23: 18 ug/min via INTRAVENOUS
  Administered 2023-01-23: 4 ug/min via INTRAVENOUS
  Filled 2023-01-23 (×2): qty 250

## 2023-01-23 MED ORDER — POLYETHYLENE GLYCOL 3350 17 G PO PACK: 17.0000 g | PACK | Freq: Every day | ORAL | Status: DC | PRN

## 2023-01-23 MED ORDER — FAMOTIDINE 20 MG PO TABS: 20.0000 mg | ORAL_TABLET | Freq: Two times a day (BID) | ORAL | Status: DC

## 2023-01-23 MED ORDER — SODIUM CHLORIDE 0.9 % IV SOLN: INTRAVENOUS | Status: DC

## 2023-01-23 MED ORDER — SODIUM CHLORIDE 0.9 % IV SOLN
3.0000 g | Freq: Four times a day (QID) | INTRAVENOUS | Status: DC
  Administered 2023-01-23: 3 g via INTRAVENOUS
  Filled 2023-01-23: qty 3
  Filled 2023-01-23 (×2): qty 8

## 2023-01-23 MED ORDER — PANTOPRAZOLE INFUSION (NEW) - SIMPLE MED
8.0000 mg/h | INTRAVENOUS | Status: DC
  Administered 2023-01-23: 8 mg/h via INTRAVENOUS
  Filled 2023-01-23 (×2): qty 100

## 2023-01-23 MED ORDER — SODIUM CHLORIDE 0.9 % IV SOLN: 10.0000 mL/h | Freq: Once | INTRAVENOUS | Status: DC

## 2023-01-23 MED ORDER — SODIUM CHLORIDE 0.9 % IV BOLUS
1000.0000 mL | Freq: Once | INTRAVENOUS | Status: AC
  Administered 2023-01-23: 1000 mL via INTRAVENOUS

## 2023-01-23 MED ORDER — NOREPINEPHRINE 16 MG/250ML-% IV SOLN
0.0000 ug/min | INTRAVENOUS | Status: DC
  Administered 2023-01-23: 33 ug/min via INTRAVENOUS
  Administered 2023-01-23: 27 ug/min via INTRAVENOUS
  Administered 2023-01-23: 40 ug/min via INTRAVENOUS
  Filled 2023-01-23 (×3): qty 250

## 2023-01-23 MED ORDER — OCTREOTIDE LOAD VIA INFUSION
100.0000 ug | Freq: Once | INTRAVENOUS | Status: AC
  Administered 2023-01-23: 100 ug via INTRAVENOUS
  Filled 2023-01-23: qty 50

## 2023-01-24 LAB — TYPE AND SCREEN
ABO/RH(D): O POS
Unit division: 0
Unit division: 0
Unit division: 0
Unit division: 0

## 2023-01-24 LAB — PREPARE RBC (CROSSMATCH)

## 2023-01-24 LAB — BPAM RBC
Blood Product Expiration Date: 202405062359
Blood Product Expiration Date: 202405272359
Blood Product Expiration Date: 202405272359
ISSUE DATE / TIME: 202404210215
ISSUE DATE / TIME: 202404210215
Unit Type and Rh: 5100
Unit Type and Rh: 5100

## 2023-02-02 NOTE — Progress Notes (Signed)
I responded to a page from the nurse to provide spiritual support for the patient's family. I arrived a the patient's room where his sister and other family members were present. I was informed that the family priest had also visited with the family. I provided spiritual support through pastoral presence, reading scripture, and prayer.    01/03/2023 1600  Spiritual Encounters  Type of Visit Initial  Care provided to: Pt and family  Conversation partners present during encounter Nurse  Referral source Nurse (RN/NT/LPN)  Reason for visit Patient death  OnCall Visit Yes  Spiritual Framework  Family Stress Factors Exhausted;Loss  Interventions  Spiritual Care Interventions Made Compassionate presence;Bereavement/grief support;Prayer    Chaplain Dr Melvyn Novas

## 2023-02-02 NOTE — Progress Notes (Signed)
Originally paged at [employer] for pt arrival to ED. Pt actively being cared for. Offered compassionate presence to brother who arrived first and then pt mother and father.   Paged second time at 4am when pt arrived to ICU Family in waiting room. Introduced care and offered support. Family currently making medical decisions and processing information from medial team
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
## 2023-02-02 NOTE — Progress Notes (Signed)
Pharmacy Antibiotic Note  Willie Delacruz is a 32 y.o. male admitted on 02/01/2023 with possible aspiration pneumonia.  Pharmacy has been consulted for Unasyn dosing.  Plan: Unasyn 3 gm q6hr for 5 days per indication & renal fxn.  Pharmacy will continue to follow and will adjust abx dosing whenever warranted.  Temp (24hrs), Avg:94.3 F (34.6 C), Min:93.8 F (34.3 C), Max:95.4 F (35.2 C)   Recent Labs  Lab 01/19/2023 0105  WBC 7.8  CREATININE 1.36*  LATICACIDVEN >9.0*    Estimated Creatinine Clearance: 82.1 mL/min (A) (by C-G formula based on SCr of 1.36 mg/dL (H)).    No Known Allergies  Antimicrobials this admission: 4/21 Unasyn >> X 5 days  Microbiology results: No lab cx currently ordered or pending at this time.  Thank you for allowing pharmacy to be a part of this patient's care.  Otelia Sergeant, PharmD, [facility] 01/16/2023 5:59 AM

## 2023-02-02 NOTE — Procedures (Signed)
Central Venous Catheter Insertion Procedure Note  KAJUAN GUYTON  [id number]  1990-12-31  Date:01/14/2023  Time:4:50 AM   Provider Performing:Eleanor Gatliff A Anjel Pardo   Procedure: Insertion of Non-tunneled Central Venous [id number]) with US guidance ([id number])   Indication(s) Medication administration and Difficult access  Consent Risks of the procedure as well as the alternatives and risks of each were explained to the patient and/or caregiver.  Consent for the procedure was obtained and is signed in the bedside chart  Anesthesia Topical only with 1% lidocaine   Timeout Verified patient identification, verified procedure, site/side was marked, verified correct patient position, special equipment/implants available, medications/allergies/relevant history reviewed, required imaging and test results available.  Sterile Technique Maximal sterile technique including full sterile barrier drape, hand hygiene, sterile gown, sterile gloves, mask, hair covering, sterile ultrasound probe cover (if used).  Procedure Description Area of catheter insertion was cleaned with chlorhexidine and draped in sterile fashion.  With real-time ultrasound guidance a central venous catheter was placed into the right internal jugular vein. Nonpulsatile blood flow and easy flushing noted in all ports.  The catheter was sutured in place and sterile dressing applied.  Complications/Tolerance None; patient tolerated the procedure well. Chest X-ray is ordered to verify placement for internal jugular or subclavian cannulation.   Chest x-ray is not ordered for femoral cannulation.  EBL Minimal  Specimen(s) None  Webb Silversmith, DNP, CCRN, FNP-C, AGACNP-BC Acute Care & Family Nurse Practitioner  [facility]  See Amion for personal pager PCCM on call pager [phone] until 7 am

## 2023-02-02 NOTE — ED Notes (Signed)
ICU NP at bedside

## 2023-02-02 NOTE — ED Notes (Signed)
Emergency Blood started [id number] and [id number].  Per NP [place] do not place bair hugger on pt at this time.
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
## 2023-02-02 NOTE — ED Notes (Signed)
Patient transported to CT 

## 2023-02-02 NOTE — ED Notes (Signed)
Pt back from CT

## 2023-02-02 NOTE — Progress Notes (Signed)
Palliative consult received.  Arrived to ICU to initiate consult. Met by Dr. Wynema Birch attending. Dr. Mervyn Skeeters met with family and has transitioned patient to comfort measures. No need for Palliative engagement at this time.  Encouraged to re-engage Palliative as needs arise or for additional support.  No Charge.  Leeanne Deed, DNP, AGNP-C Palliative Medicine  Please call Palliative Medicine team phone with any questions [phone]. For individual providers please see AMION.
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
## 2023-02-02 NOTE — Progress Notes (Signed)
PT TRANSPORTED TO AND FROM MRI WITH RN WITH NO COMPLICATIONS

## 2023-02-02 NOTE — Progress Notes (Signed)
Medical Examiner and [employer] notified of death. ME states pt will go to [facility] for autopsy.

## 2023-02-02 NOTE — Progress Notes (Signed)
Extubated to comfort care, morphine drip running, family at bedside.

## 2023-02-02 NOTE — ED Notes (Signed)
Patient back from CT.

## 2023-02-02 NOTE — IPAL (Signed)
GOALS OF CARE FAMILY CONFERENCE   Current clinical status, hospital findings and medical plan was reviewed with family.   I HAD MEETING WITH NUMEROUS FAMILY MEMBERS PRESENT (OVER 15 PEOPLE) , INCLUDING KIDS AND MOTHER OF CHILDREN, PATIENTS FATHER AND CLOSE RELATIVES AND FRIENDS  Updated and notified of patients ongoing immediate critical medical problems.   Patient remains unresponsive acutely comatose    Patient is unable to breathe independently, unable to protect airway and unable to mobilize secretions.    Explained to family course of therapy and the modalities   Patient with Progressive multiorgan failure with high probability of a very minimal chance of meaningful recovery despite aggressive and optimal medical therapy.   Family is appreciative of care and relate understanding that patient is severely critically ill with anticipation of passing away during this hospitalization.   They have consented and agreed to COMFORT CARE Code status   Family are satisfied with Plan of action and management. All questions answered  Additional Critical Care time 35 mins    Willie Delacruz, M.D.  Pulmonary & Critical Care Medicine  [facility]
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
## 2023-02-02 NOTE — ED Notes (Signed)
Patient had positive color change after intubation
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
## 2023-02-02 NOTE — Progress Notes (Signed)
1700-Medical examiner in with pt.

## 2023-02-02 NOTE — Progress Notes (Signed)
0800-Pt assessment complete. Pt is on the vent. With no sedatives. Pt has no neuro response at this time. Md is updated. 1230-pt bp declined. Request for more vasoactive iv drips. Placed. Orders received.  1500-family decided to move forward with comfort care.  1532-pt was terminally extubated.  1545-Pt pronounced dead. Family was at bedside when pt was pronounced.

## 2023-02-02 NOTE — Procedures (Signed)
Arterial Catheter Insertion Procedure Note  Cuthbert Turton [facility]  [id number]  04-22-1991  Date:01/22/2023  Time:4:48 AM   Provider Performing: Loraine Leriche   Procedure: Insertion of Arterial Line (44010) with US guidance (27253)   Indication(s) Blood pressure monitoring and/or need for frequent ABGs  Consent Risks of the procedure as well as the alternatives and risks of each were explained to the patient and/or caregiver.  Consent for the procedure was obtained and is signed in the bedside chart  Anesthesia None  Time Out Verified patient identification, verified procedure, site/side was marked, verified correct patient position, special equipment/implants available, medications/allergies/relevant history reviewed, required imaging and test results available.  Sterile Technique Maximal sterile technique including full sterile barrier drape, hand hygiene, sterile gown, sterile gloves, mask, hair covering, sterile ultrasound probe cover (if used).  Procedure Description Area of catheter insertion was cleaned with chlorhexidine and draped in sterile fashion. With real-time ultrasound guidance an arterial catheter was placed into the right radial artery.  Appropriate arterial tracings confirmed on monitor.    Complications/Tolerance None; patient tolerated the procedure well.  EBL Minimal  Specimen(s) None  Webb Silversmith, DNP, CCRN, FNP-C, AGACNP-BC Acute Care & Family Nurse Practitioner  [facility]  See Amion for personal pager PCCM on call pager [phone] until 7 am

## 2023-02-02 NOTE — Progress Notes (Signed)
Spoke with Angelique Blonder at [employer]. Updated with neurologist's finding.

## 2023-02-02 NOTE — IPAL (Signed)
GOALS OF CARE  Interdisciplinary Goals of Care Family Meeting     Date carried out: 01/16/2023 Location of the meeting: Bedside   Member's involved: NP and Family Member or next of kin: Patient's mother and father, brother and sister   Durable Power of Pensions consultant or Environmental health practitioner:    Discussion:  Advance Care Planning/Goals of Care discussion was performed during the course of treatment to decide on type of care right for this patient following admission to the ICU.   I met with patient's family including mother, father, brother and sister to discuss goals of care in details following cardiac arrest Reviewed patient's worsening lab, ABGs, vital signs including unstable blood pressure requiring multiple pressors  and overall poor neurological exam as below .I answered all their question to the best of my knowledge and also addressed their concerns and request to transfer to tertiary care center.   FOCUSED NEURO EXAM Patient is currently intubated, mechanically ventilated and sedated. Sedation was held briefly for focused Neuro Exam. Patient does not respond to verbal stimuli.  Does not respond to deep sternal rub.  Does not follow commands.  No verbalizations noted. Cranial Nerves: II: patient does not respond confrontation bilaterally, pupils right 8 mm, left 8 mm,and non-reactive bilaterally III,IV,VI: doll's response absent bilaterally. V,VII: corneal reflex absent bilaterally  VIII: patient does not respond to verbal stimuli IX,X: gag reflex absent, XI: trapezius strength unable to test bilaterally XII: tongue strength unable to test Motor: Extremities flaccid throughout.  No spontaneous movement noted.  No purposeful movements noted. Sensory: Does not respond to noxious stimuli in any extremity. Deep Tendon Reflexes: Absent throughout. Plantars: absent bilaterally Cerebellar: Unable to perform    Discussed prognosis with poor neurological exam as above, expected  outcome with or without ongoing aggressive treatments and the options for de-escalation of care.   Diagnosis(es): Acute hypoxic respiratory failure secondary asphyxiation/strangulation, V-fib/PEA cardiac arrest, Prognosis: Poor Code Status: FULL Disposition: ICU Next Steps:  Family understands the situation. They would wish to  continue with aggressive treatment at this time pending further work up.     Family are satisfied with Plan of action and management. All questions answered     Total Time Spent Face to Face addressing advance care planning in the presence of the Patient: 35 minutes      Webb Silversmith, DNP, FNP-C, AGACNP-BC Acute Care Nurse Practitioner [place] [facility] Pager: [phone] [place] [facility]

## 2023-02-02 NOTE — ED Notes (Signed)
Officer Willie Delacruz given all of patients belongs in a brown paper bag

## 2023-02-02 NOTE — Progress Notes (Addendum)
MRI consistent with extensive injury. His left pupil is no longer reactive. He has not had any breathing over the vent, but apnea test is currently precluded due to instability. At this point, with this devastating injury in a likely brain dead patient, I do not think that actively coding in the setting of cardiac arrest would have any chance at improving his prognosis.   I discussed this with Dr. Karna Christmas who agrees and therefore have made him DNR based on futility.   Will continue support pending conversations with family.   Ritta Slot, MD [facility] [phone]  If 7pm- 7am, please page neurology on call as listed in AMION.   Addendum:  Discussed with wite, they are in agreement with DNR, plan to withdraw care once some other family arrives.  Ritta Slot, MD [facility] [phone]  If 7pm- 7am, please page neurology on call as listed in AMION.
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
## 2023-02-02 NOTE — Progress Notes (Signed)
Notified [facility]  [id number]  Willie Delacruz

## 2023-02-02 NOTE — Progress Notes (Signed)
1800-post mortem care provided.

## 2023-02-02 DEATH — deceased
